# Patient Record
Sex: Male | Born: 1965 | Race: White | Hispanic: No | Marital: Married | Smoking: Current every day smoker
Health system: Southern US, Community
[De-identification: ages and names within clinical notes are randomized; demographics above are authoritative.]

## PROBLEM LIST (undated history)

## (undated) DIAGNOSIS — F1020 Alcohol dependence, uncomplicated: Secondary | ICD-10-CM

## (undated) DIAGNOSIS — F419 Anxiety disorder, unspecified: Secondary | ICD-10-CM

## (undated) DIAGNOSIS — I10 Essential (primary) hypertension: Secondary | ICD-10-CM

## (undated) DIAGNOSIS — J45909 Unspecified asthma, uncomplicated: Secondary | ICD-10-CM

---

## 2003-04-20 ENCOUNTER — Ambulatory Visit (HOSPITAL_COMMUNITY): Admission: RE | Admit: 2003-04-20 | Discharge: 2003-04-20 | Payer: Self-pay | Admitting: General Surgery

## 2003-12-17 ENCOUNTER — Ambulatory Visit (HOSPITAL_COMMUNITY): Admission: RE | Admit: 2003-12-17 | Discharge: 2003-12-17 | Payer: Self-pay | Admitting: Family Medicine

## 2005-07-09 ENCOUNTER — Emergency Department (HOSPITAL_COMMUNITY): Admission: EM | Admit: 2005-07-09 | Discharge: 2005-07-09 | Payer: Self-pay | Admitting: Emergency Medicine

## 2006-07-26 ENCOUNTER — Encounter (HOSPITAL_COMMUNITY): Admission: RE | Admit: 2006-07-26 | Discharge: 2006-08-25 | Payer: Self-pay | Admitting: Family Medicine

## 2006-08-07 ENCOUNTER — Ambulatory Visit: Payer: Self-pay | Admitting: Internal Medicine

## 2006-08-22 ENCOUNTER — Ambulatory Visit: Payer: Self-pay | Admitting: Internal Medicine

## 2006-08-22 ENCOUNTER — Encounter: Payer: Self-pay | Admitting: Internal Medicine

## 2006-08-22 ENCOUNTER — Ambulatory Visit (HOSPITAL_COMMUNITY): Admission: RE | Admit: 2006-08-22 | Discharge: 2006-08-22 | Payer: Self-pay | Admitting: Internal Medicine

## 2006-09-13 ENCOUNTER — Ambulatory Visit (HOSPITAL_COMMUNITY): Admission: RE | Admit: 2006-09-13 | Discharge: 2006-09-13 | Payer: Self-pay | Admitting: Family Medicine

## 2007-07-12 IMAGING — CR DG SMALL BOWEL
14 series · 14 of 14 positions shown · non-contrast
Comparison: none

HISTORY: Refractory nausea, vomiting, diarrhea, weight loss, abdominal pain

[run (1 of 10)]
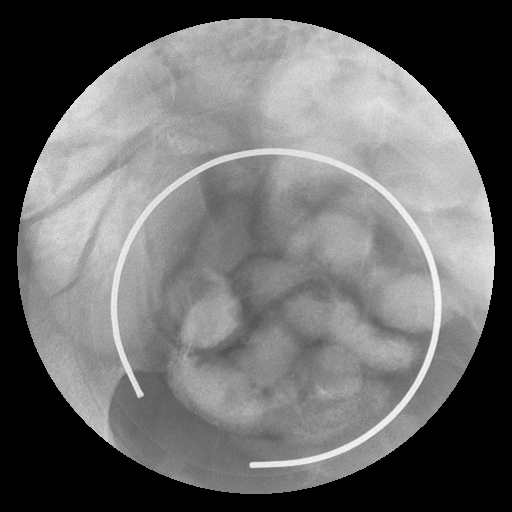

[run (2 of 10)]
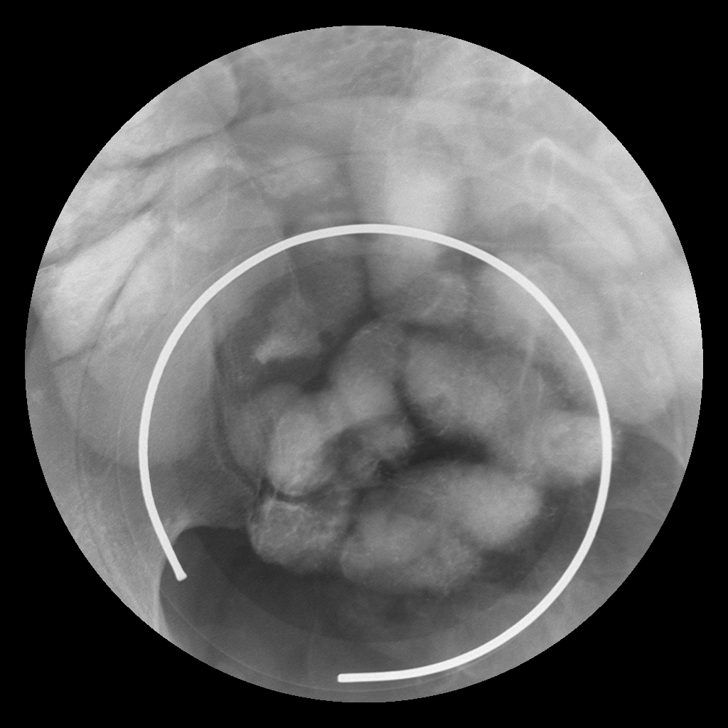

[run (3 of 10)]
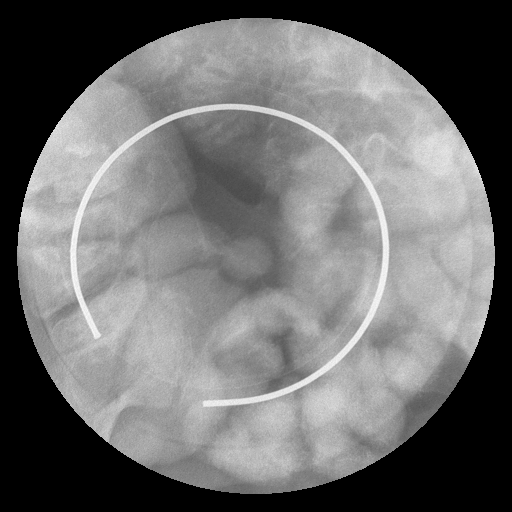

[run (4 of 10)]
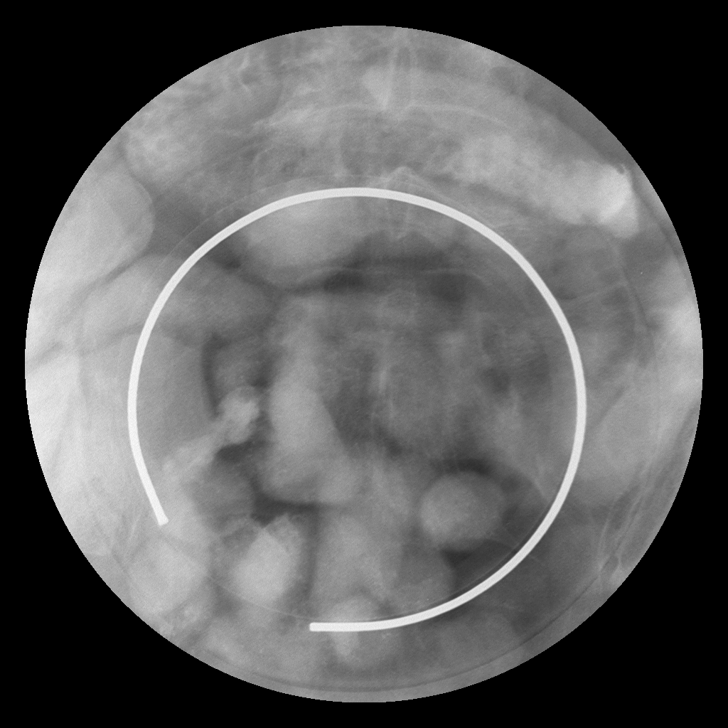

[run (5 of 10)]
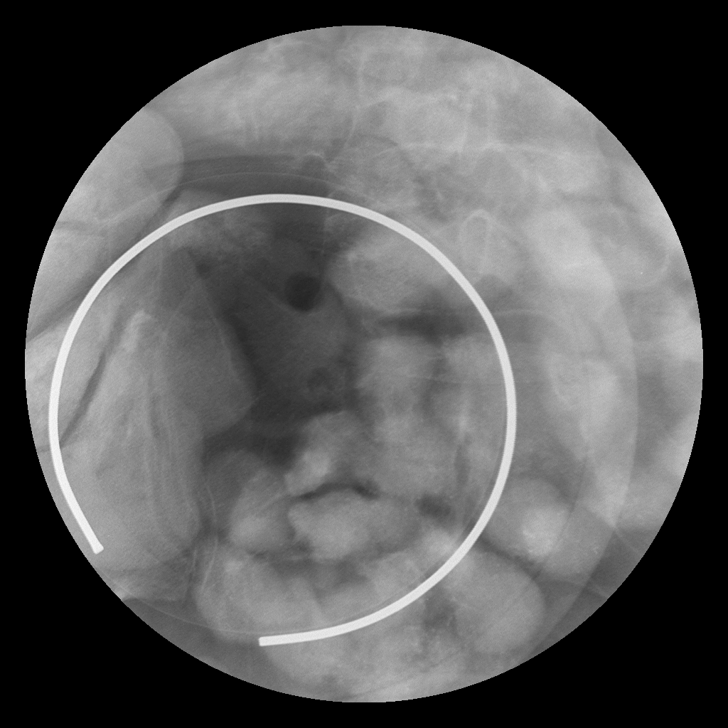

[run (6 of 10)]
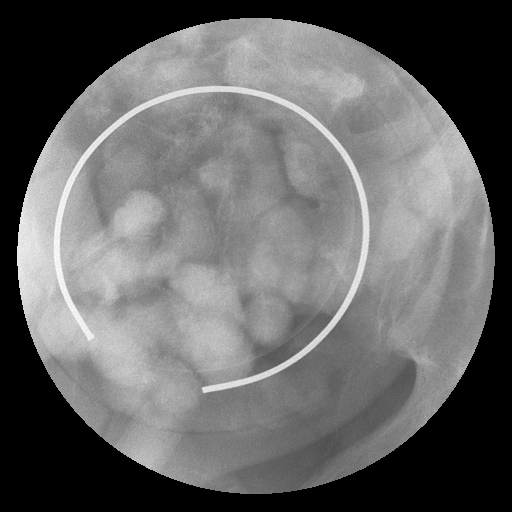

[run (7 of 10)]
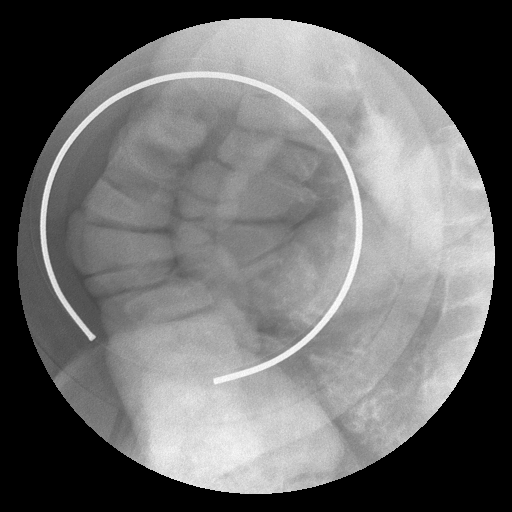

[run (8 of 10)]
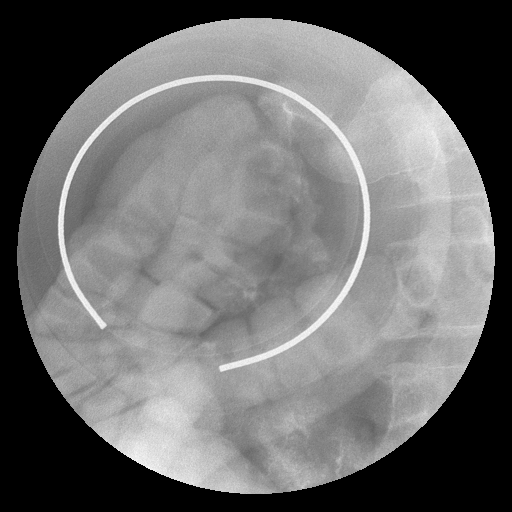

[run (9 of 10)]
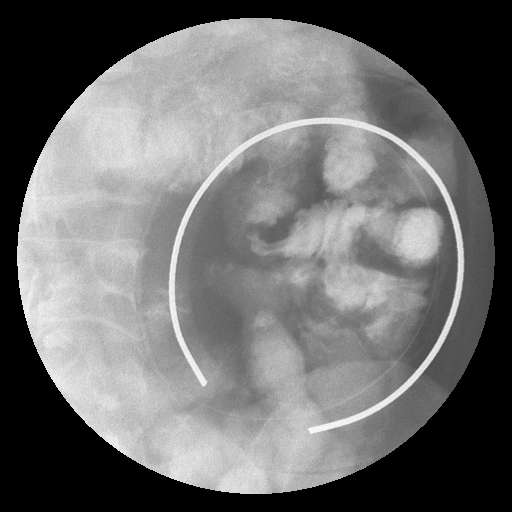

[run (10 of 10)]
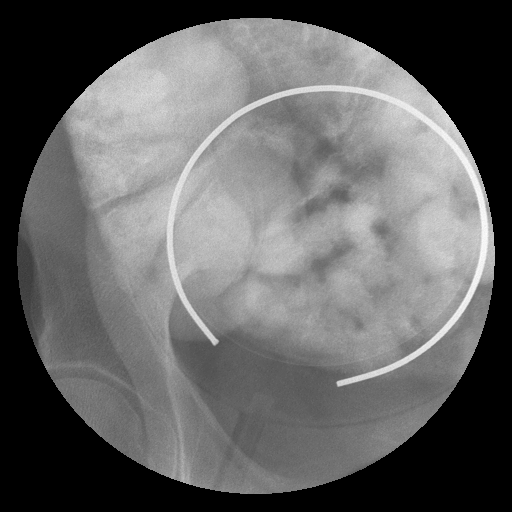

[view not recorded (1 of 4)]
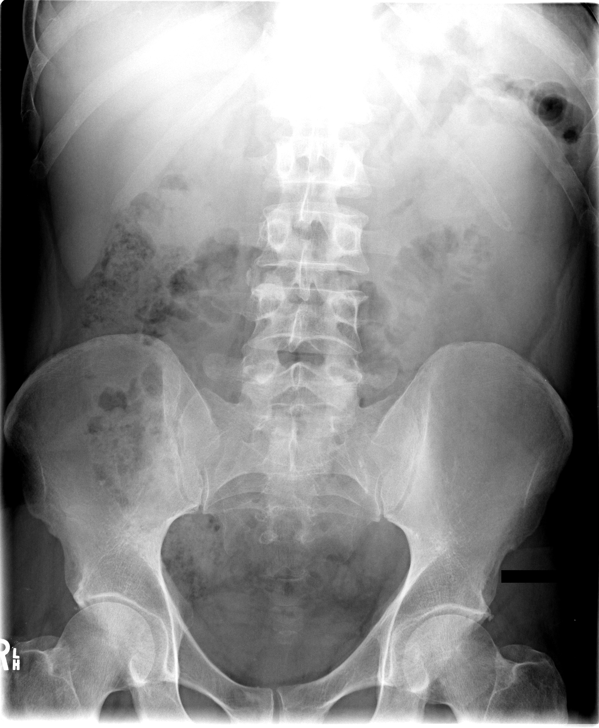

[view not recorded (2 of 4)]
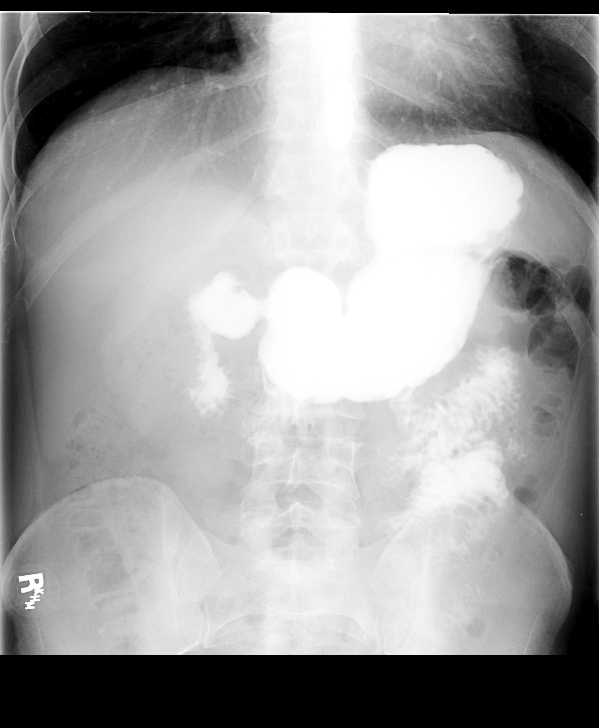

[view not recorded (3 of 4)]
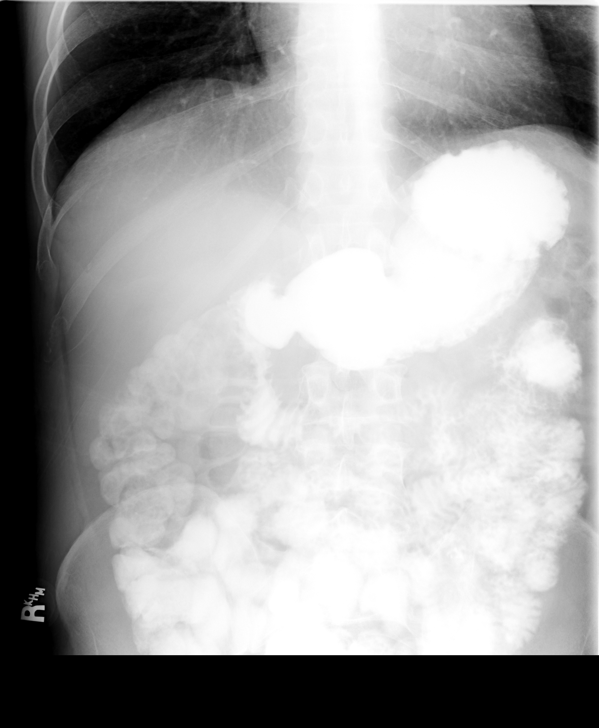

[view not recorded (4 of 4)]
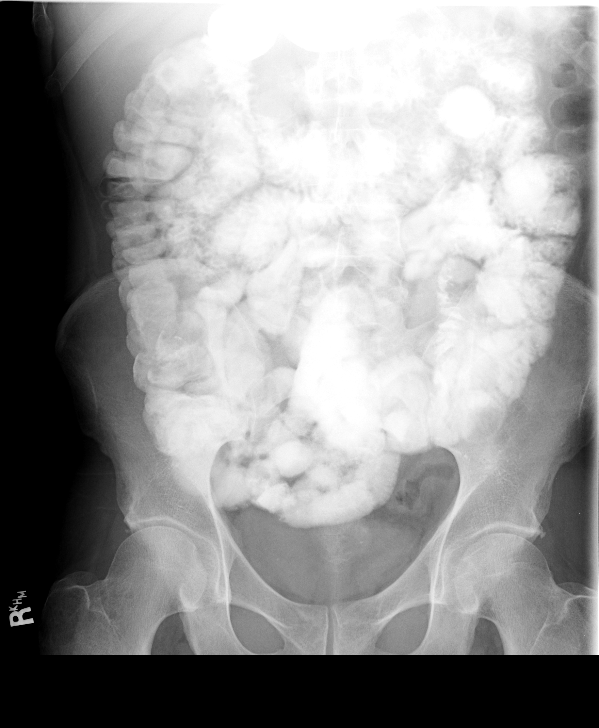

[14 of 14 positions shown; findings below may reference images not displayed]

SMALL BOWEL FOLLOW-THROUGH:

Normal bowel gas pattern on scout image.
No bony abnormalities or urinary tract calcification.
Routine small bowel follow-through performed.

Rapid transit of contrast through small bowel.
Contrast is present in right and transverse colon at 15 minutes following
ingestion of contrast.
Structurally, normal appearing jejunal and ileal loops are noted.
Normal mucosal fold pattern and wall thickness.
No mucosal fold or bowel wall edema, stricture, or dilatation.
No persistent intraluminal filling defects.
Focal compression views of terminal ileum normal.
Visualized: Normal.
Appendix not visualized.
IMPRESSION: Structurally normal appearing jejunal and ileal small bowel loops.
Significant small bowel hypermotility with contrast transiting small bowel to
colon in 15 minutes.
Differential diagnosis of intestinal hypermotility with structurally normal
appearing small bowel loops includes irritable bowel syndrome, thyrotoxicosis,
drugs, hormonal over production such as due to pheochromocytoma, carcinoid,
gastrinoma.
The presence normal appearing mucosal folds and wall thickness makes Crohn's
disease and infectious enteritis etiologies much less likely.

## 2008-01-23 ENCOUNTER — Encounter (INDEPENDENT_AMBULATORY_CARE_PROVIDER_SITE_OTHER): Payer: Self-pay | Admitting: General Surgery

## 2008-01-23 ENCOUNTER — Ambulatory Visit (HOSPITAL_COMMUNITY): Admission: RE | Admit: 2008-01-23 | Discharge: 2008-01-23 | Payer: Self-pay | Admitting: General Surgery

## 2008-02-27 ENCOUNTER — Ambulatory Visit (HOSPITAL_COMMUNITY): Admission: RE | Admit: 2008-02-27 | Discharge: 2008-02-27 | Payer: Self-pay | Admitting: Family Medicine

## 2008-12-25 IMAGING — CR DG FOOT COMPLETE 3+V*R*
3 series · 3 of 3 positions shown · non-contrast
Comparison: None

CLINICAL DATA: Pain over the dorsum of the right foot

RIGHT FOOT COMPLETE - 3+ VIEW

[view not recorded (1 of 3)]
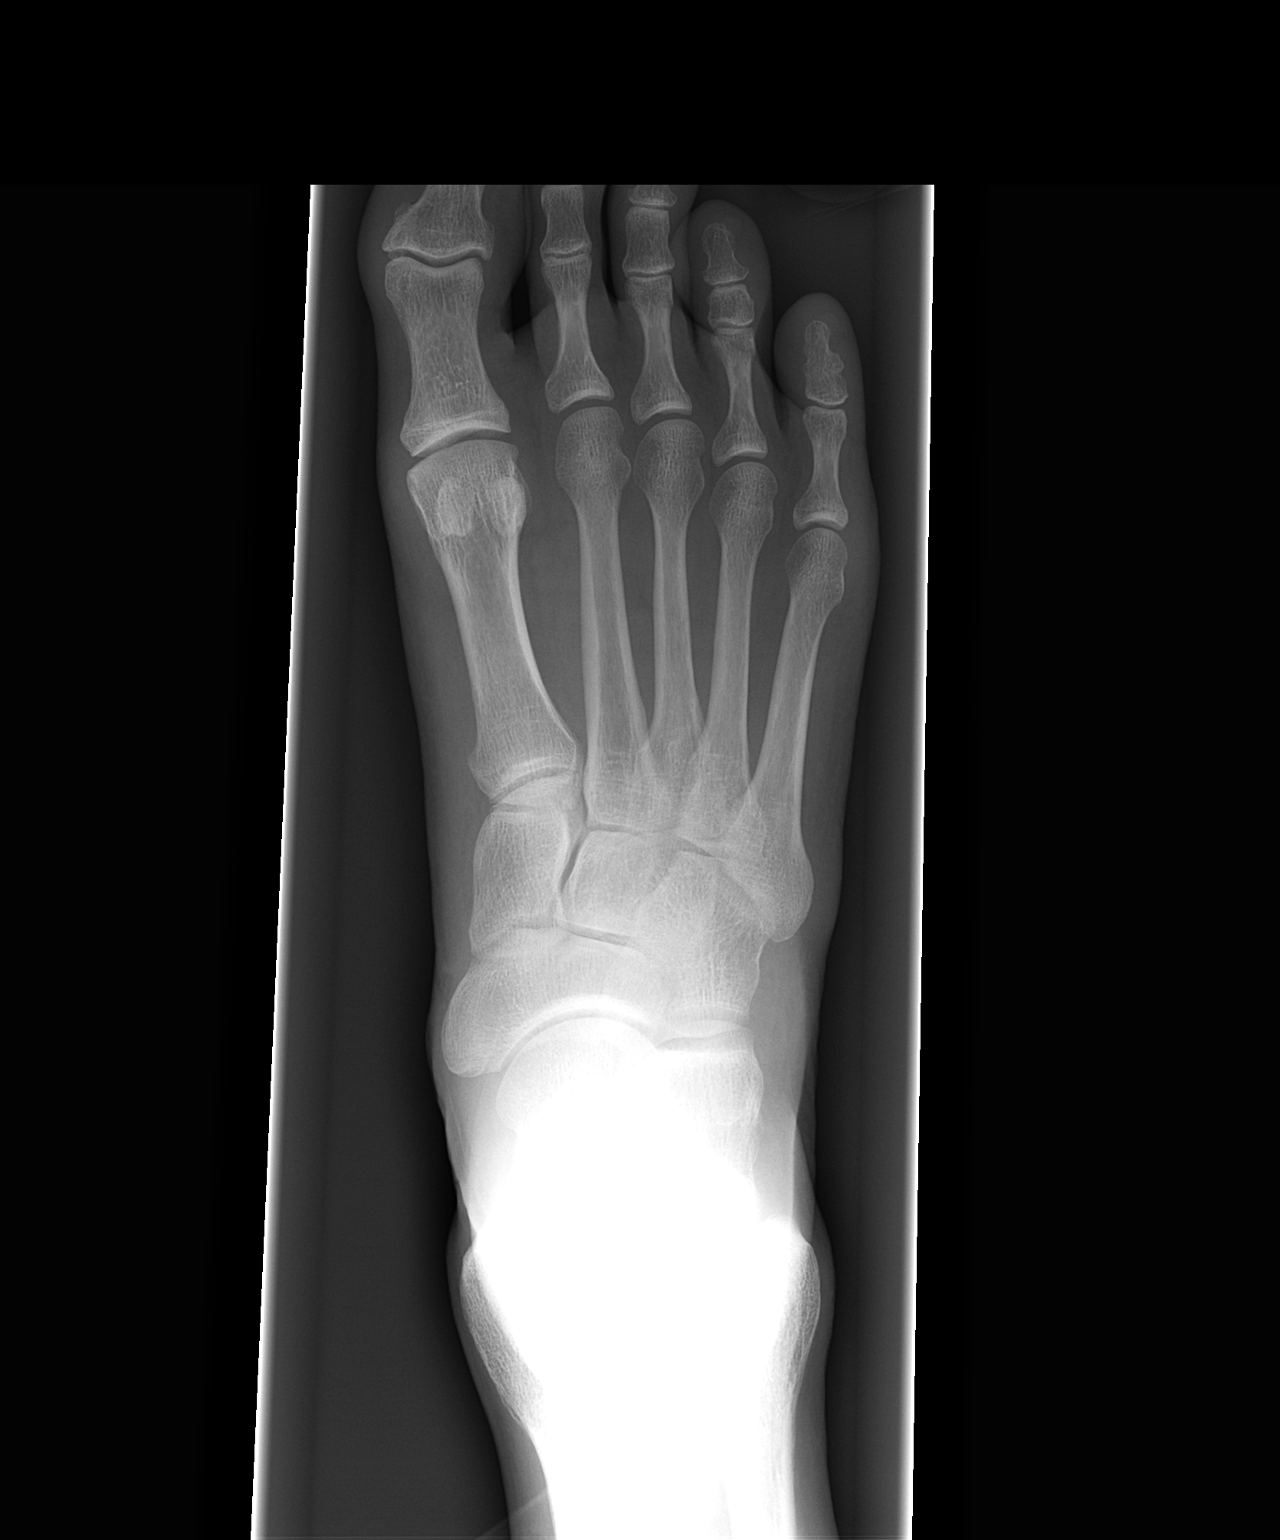

[view not recorded (2 of 3)]
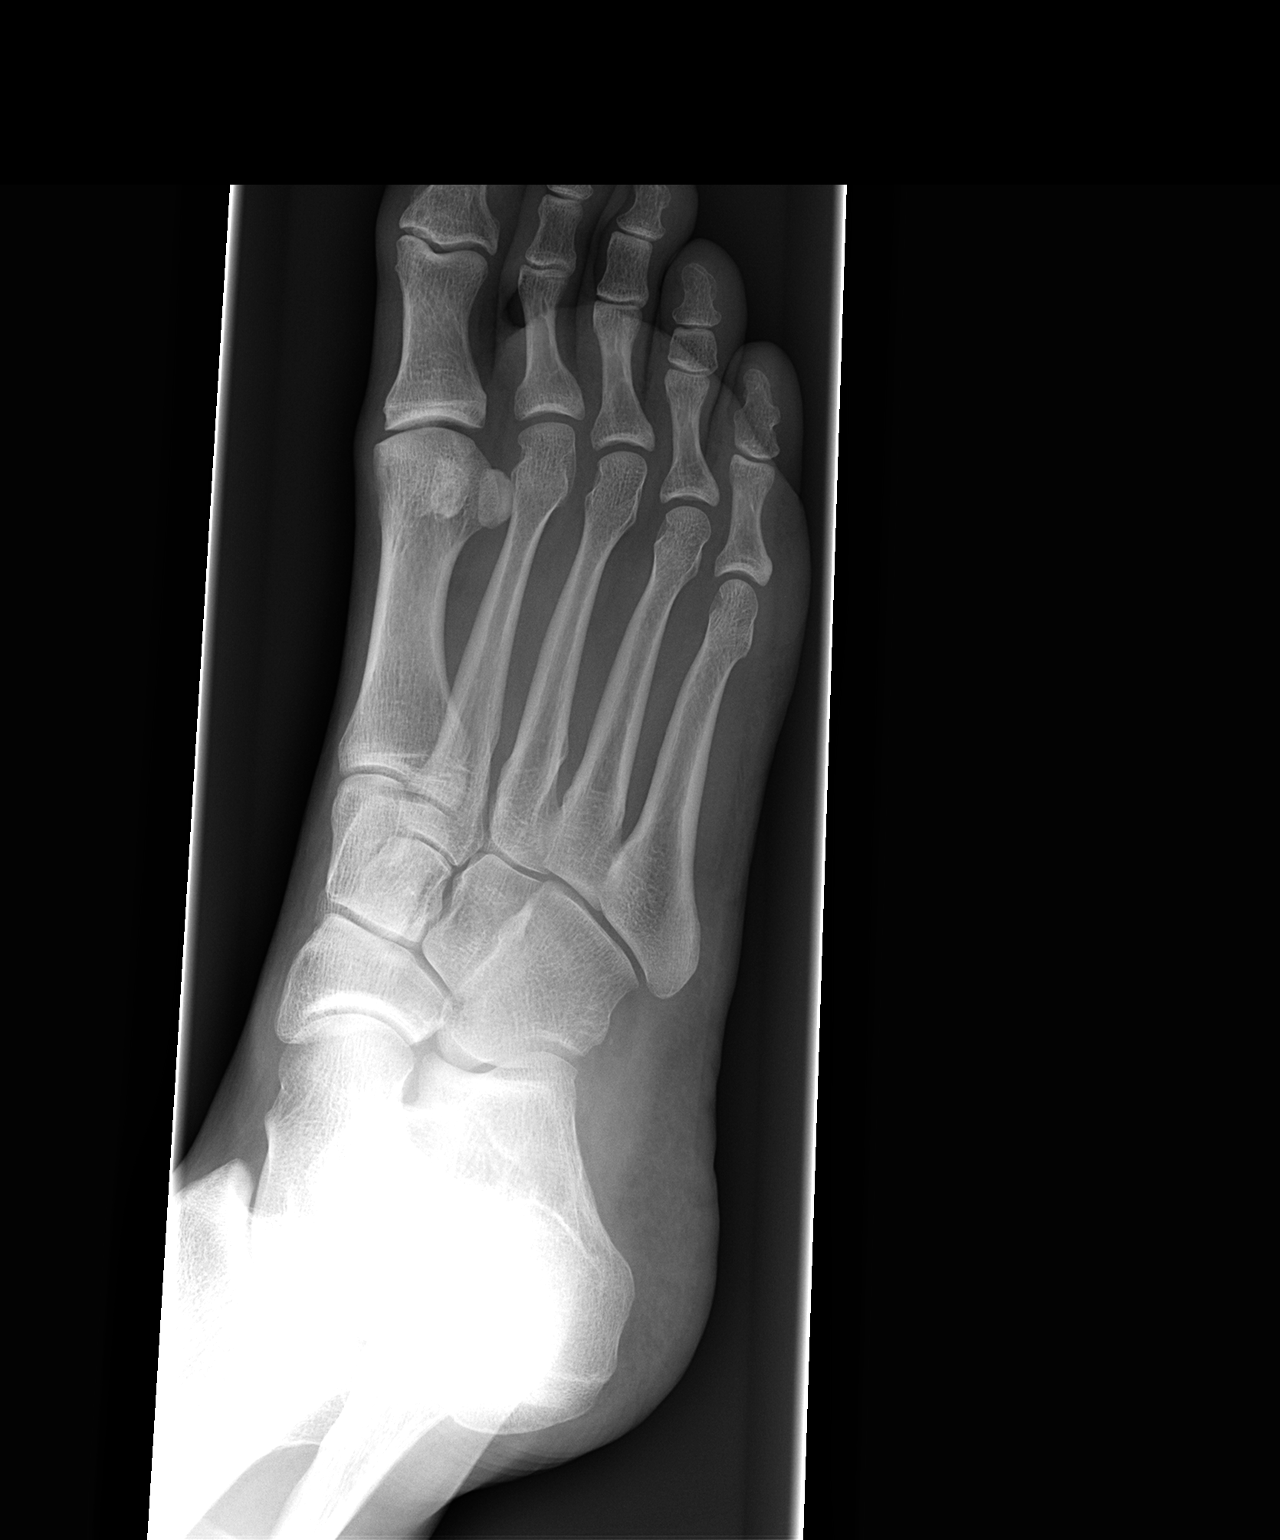

[view not recorded (3 of 3)]
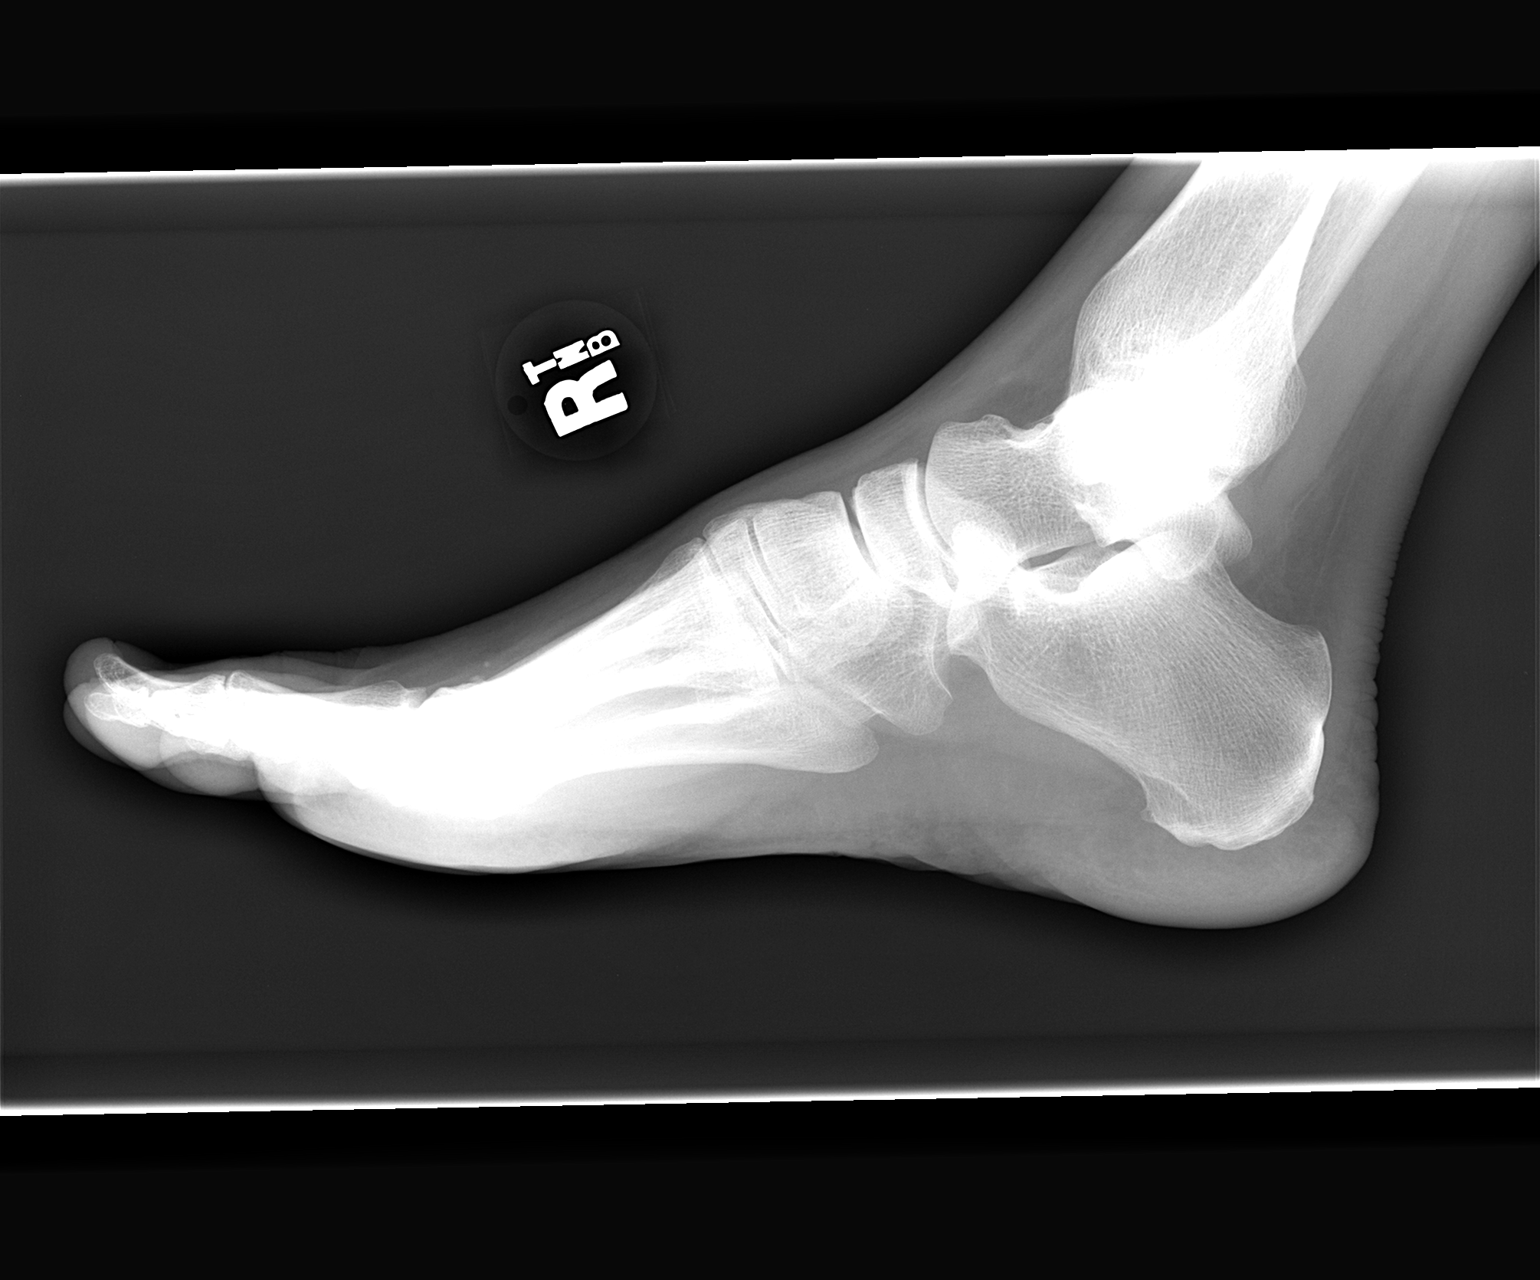

[3 of 3 positions shown; findings below may reference images not displayed]

FINDINGS: There is no evidence of fracture or dislocation.  There
is no evidence of arthropathy or other focal bone abnormality.
Soft tissues are unremarkable.
IMPRESSION: Negative.

## 2009-06-27 ENCOUNTER — Encounter: Payer: Self-pay | Admitting: Internal Medicine

## 2010-04-18 NOTE — Letter (Signed)
Summary: DISABILITY CLAIM  DISABILITY CLAIM   Imported By: Diana Eves 06/27/2009 11:56:22  _____________________________________________________________________  External Attachment:    Type:   Image     Comment:   External Document

## 2010-08-01 NOTE — Op Note (Signed)
NAMECHIDERA, THIVIERGE NO.:  [id number]   MEDICAL RECORD NO.:  [id number]          PATIENT TYPE:  AMB   LOCATION:  DAY                           FACILITY:  [facility]   PHYSICIAN:  R. Roetta Sessions, M.D. DATE OF BIRTH:  [birth year]   DATE OF PROCEDURE:  08/22/2006  DATE OF DISCHARGE:                               OPERATIVE REPORT   PROCEDURE PERFORMED:  Esophagogastroduodenoscopy with gastric and  duodenal biopsy followed by diagnostic colonoscopy.   INDICATIONS FOR PROCEDURE:  45 year old gentleman with a two month  history of intermittent early morning nausea, vomiting, diarrhea, and  intermittent low volume blood per rectum.  He has lost some weight.  He  does drink beer on a regular basis.  He has not gotten any better with  acid suppression therapy or with an empiric course of metronidazole  therapy.  EGD and colonoscopy is now being done.  This approach has been  discussed with the patient at length.  The potential risks, benefits and  alternatives have been reviewed, questions answered.  He is agreeable.  Please see documentation in the medical record.   ESOPHAGOGASTRODUODENOSCOPY PROCEDURE:  This gentleman was taken back to  the endoscopy unit.  We gave him 6 mg of Versed, 100 mg of Demerol, 25  mg Phenergan diluted slow IV push, and Cetacaine spray for topical  pharyngeal anesthesia.  Instrumentation used was Pentax video chip  system.  I intubated the esophagus, however, when doing so, Mr. Vicenta Dunning  became combative and fought [place].  Therefore, I aborted the procedure.  It  was felt that this procedure would be best done under monitored  anesthesia with the help of Dr. Jayme Cloud and associates.  We contacted  Dr. Jayme Cloud and associates and ended up regrouping and going back to  the OR where he was given propofol and fentanyl.  Please note that even  with significant doses of propofol and fentanyl, he at times during both  procedures, was somewhat combative and  we had to keep him still with  physical restraint.  However, both procedures were carried out with  findings as below.   Examination of the tubular esophagus revealed no mucosal abnormality.  The EG junction was easily traversed.  Stomach:  The gastric cavity was empty and insufflated well with air.  A  thorough examination of the gastric mucosa including retroflexion in the  proximal stomach and esophagogastric junction demonstrated only a small  hiatal hernia.  The gastric mucosa, otherwise, appeared normal.  The  pylorus was patent, easily traversed, and the bulb and second portion  revealed no abnormalities.   THERAPEUTIC/DIAGNOSTIC MANEUVERS PERFORMED:  Biopsies of the duodenal  and gastric mucosa were done to rule out eosinophilic gastroenteritis.  The patient tolerated this procedure, overall fairly well, and was  prepared for colonoscopy.   COLONOSCOPY PROCEDURE:  Digital rectal exam revealed no abnormalities.  Endoscopic findings:  The prep was adequate.  Colon:  Colonic mucosa was surveyed from the rectosigmoid junction  through the left, transverse, right colon to the appendiceal orifice,  ileocecal valve, and cecum.  These structures were well  seen  and  photographed for the record.  From this level, the scope was slowly  withdrawn. All previously mentioned mucosal surfaces were again seen.  The colonic mucosa appeared normal.  The scope was pulled down to the  rectum where a thorough examination of the rectal mucosa was undertaken.  The rectal vault was small.  I attempted to but was unable to retroflex.  The patient had some friable anal canal hemorrhoids and anal papilla.  Otherwise, the rectal mucosa appeared normal.  The patient was then  transferred to the PACU in good condition.   IMPRESSION:  1. Normal esophagus, small hiatal hernia, otherwise, normal stomach,      status biopsies D1 and D2.  2. Anal papilla and anal canal hemorrhoids, otherwise, normal  rectum.      Normal appearing colonic mucosa.   I suspect the patient has bled secondary to hemorrhoids.  He does have  an elevated peripheral eosinophil count. Biopsies of the upper GI tract  were taken to rule out eosinophilic gastroenteritis.  It is my present  impression that Mr. Belson drinks too much beer.  This may be  contributing to his diarrhea and rectal bleeding.   RECOMMENDATIONS:  1. Curtail alcohol consumption.  2. Anusol HC suppositories, one per rectum at bedtime.  3. Follow-up on path.  4. Further recommendations to follow.      [name], M.D.  Electronically Signed     RMR/MEDQ  D:  08/22/2006  T:  08/22/2006  Job:  [id number]   cc:   Patrica Duel, M.D.  Fax: [phone]

## 2010-08-01 NOTE — H&P (Signed)
Zachary Schmidt, SENGER NO.:  [id number]   MEDICAL RECORD NO.:  [id number]          PATIENT TYPE:  AMB   LOCATION:  DAY                           FACILITY:  [facility]   PHYSICIAN:  Tilford Pillar, MD      DATE OF BIRTH:  1965/12/20   DATE OF ADMISSION:  DATE OF DISCHARGE:  LH                              HISTORY & PHYSICAL   CHIEF COMPLAINT:  Cyst on face.   HISTORY OF PRESENT ILLNESS:  The patient is a 45 year old male with a  several-month history of a right facial cyst.  He had multiple episodes  of discharge from this and did have this cyst I&D in the past.  He has  also noticed a mole in the similar area, which is suspicious has changed  over the last several years and has had no fever or chills, no nausea  and vomiting, and no pain associated with these __________ areas.   PAST MEDICAL HISTORY:  Hypertension and history of asthma.   PAST SURGICAL HISTORY:  None.   MEDICATIONS:  Vasotec and albuterol 1-2 times per year.   ALLERGIES:  No known drug allergies.   SOCIAL HISTORY:  He is a 2-pack per day smoker.  Alcohol, 2-4 beers per  day.   OCCUPATION:  He is a Naval architect.   PERTINENT FAMILY HISTORY:  Diabetes mellitus and history of heart  disease.   REVIEW OF SYSTEMS:  Unremarkable for all systems including  constitutional, eyes, ears, nose and throat, respiratory,  cardiovascular, gastrointestinal, genitourinary, musculoskeletal, skin,  endocrine, and neuro.   PHYSICAL EXAMINATION:  GENERAL:  The patient is a healthy calm-appearing  male in no acute distress.  He is alert and oriented x3.  HEENT:  Scalp, no deformities.  He does have on his face a small,  approximately, 1-cm mobile round nodule in the  inferior and right  lateral to his right periorbital region.  He also has a small  preauricular papular skin lesion.  Eyes, pupils are equal, round and  reactive.  Extraocular movements are intact.  No conjunctival pallor is noted.  No diminished  hearing is apparent.  Oral mucosa was pink.  Normal occlusion.  NECK:  Trachea is midline.  No thyroid nodularity is noted.  No cervical  lymphadenopathy.  PULMONARY:  He has unlabored respiration.  No wheezes.  No crackles.  He  is clear to auscultation bilaterally.  CARDIOVASCULAR:  Regular rate and  rhythm.  No murmurs.  No gallops are apparent.  He has 2+ radial pulses  bilaterally.  ABDOMEN:  Positive bowel sounds.  Soft and nontender.  SKIN:  Warm and dry.   ASSESSMENT AND PLAN:  1. Sebaceous cyst in the right face.  2. Papular skin lesion of the right preauricular area.  At this time,      I did discuss with the patient the risks, benefits, and      alternatives of excision of these areas as an outpatient including,      but not limited to risk of bleeding, infection, or recurrence.  At  this point, the patient understands and does wish to proceed at the      earliest convenience.      Tilford Pillar, MD  Electronically Signed     BZ/MEDQ  D:  12/18/2007  T:  12/19/2007  Job:  [id number]   cc:   [facility]   Short Stay Surgery

## 2010-08-01 NOTE — Consult Note (Signed)
Zachary Schmidt, Zachary Schmidt               ACCOUNT NO.:  [id number]   MEDICAL RECORD NO.:  [id number]          PATIENT TYPE:  AMB   LOCATION:                                FACILITY:  [facility]   PHYSICIAN:  R. Roetta Sessions, M.D. DATE OF BIRTH:  [birth year]   DATE OF CONSULTATION:  08/07/2006  DATE OF DISCHARGE:                                 CONSULTATION   REASON FOR CONSULTATION:  Nausea, vomiting, diarrhea.   HISTORY OF PRESENT ILLNESS:  Mr. Zachary Schmidt is a pleasant 45 year old  Caucasian male sent over at the courtesy of Dr. Patrica Duel to further  evaluate at least a 6-week history of intermittent early morning nausea,  vomiting and diarrhea of 3-4 loose bowel movements daily, occasional  blood per rectum with stooling when he had multiple bowel movements in a  given day.  He states he has lost a little weight recently, but is  unsure how to quantify this observation.  He does have some reflux  symptoms.  No odynophagia, no dysphagia.  He has not had any melena.  He  was started on Zegerid 40 mg orally daily about 6 weeks ago but could  not tell any difference in his symptoms.  He also takes Phenergan.  He  tells me this is just how he felt when he was diagnosed with an ulcer  down at [facility] in 1986 on EGD.  He does not really take any nonsteroidal  agents.  Apparently, he had some stool studies through Dr. Geanie Logan  office which were negative.  He was treated empirically with a course of  Flagyl, and he also had some labs which were reportedly normal, but do  not have any of those study results for review at this time.   He has not been on any antibiotics recently.  He does have well water at  home, but everybody else is drinking it and is doing okay.  He did have  a colonoscopy 2 years ago done by Dr. Lovell Sheehan for some rectal bleeding,  and it was reported to be negative.   PAST MEDICAL HISTORY:  1. Hypertension.  2. Asthma.  3. History of peptic ulcer disease.  4. 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  Dr. Karilyn Cota saw him briefly and may have      performed a sigmoidoscopy or colonoscopy at that time for rectal      bleeding without significant findings.   PAST SURGICAL HISTORY:  None.   CURRENT MEDICATIONS:  1. Zegerid 40 mg daily.  2. Enalapril 20 mg daily.  3. Phenergan p.r.n.  4. Vicodin p.r.n.   ALLERGIES:  No known drug allergies.   FAMILY HISTORY:  Negative for chronic GI or liver illness.  Mother has  hypertension and diabetes.  Father has hypertension, diabetes and  emphysema.   SOCIAL HISTORY:  The patient is married.  He works as a Curator for  [employer].  He has 2 children in good health.  He smokes 2 packs of  cigarettes per  day.  He drinks 6 to 8 to 12 beers on the weekend; none  during the week.  No illicit drugs.   REVIEW OF SYSTEMS:  No chest pain, no dyspnea on exertion.   PHYSICAL EXAMINATION:  GENERAL:  A 45 year old gentleman resting  comfortably.  VITAL SIGNS:  Weight 158, height 5'8, temperature 97.9, blood pressure  118/84, pulse 67.  SKIN:  Warm and dry.  There is no jaundice or stigmata of chronic liver  disease.  HEENT:  No scleral icterus.  Conjunctivae were pink.  Oral cavity with  no lesions.  CHEST:  Lungs are clear to auscultation.  CARDIAC:  Regular rate and rhythm without murmur, gallop, or rub.  ABDOMEN:  Nondistended, positive bowel sounds, soft, nontender, without  appreciable mass or organomegaly.  EXTREMITIES:  No edema.  RECTAL:  Deferred until the time of colonoscopy.   IMPRESSION:  Mr. Zachary Schmidt is a pleasant 45 year old gentleman now with  a 44-month history of intermittent early morning nausea, vomiting and  diarrhea with intermittent blood per rectum over the same period of  time.  He has lost some weight.  Stool studies are reportedly negative.  He has not responded to a course of metronidazole.  Acid suppression  therapy has made no difference, as  well.  He has a history of peptic  ulcer disease.  At this point, the etiology of his symptoms is not well  defined, and he may have more than one process ongoing.   RECOMMENDATIONS:  I feel for the most expeditious approach we can go  ahead and offer him an EGD and a colonoscopy at this time.  Potential  risks, benefits and alternatives have been reviewed.  Questions  answered.  He is agreeable.  Will made further recommendations once  endoscopic evaluation takes place.  Will also retrieve the above-  mentioned results of her studies already done for review.  Will also get  a copy of Dr. Lovell Sheehan' colonoscopy report.  Will make further  recommendations in the very near future.   I would like to Dr. Patrica Duel for allowing me to see this nice  gentleman today.      [name], M.D.  Electronically Signed     RMR/MEDQ  D:  08/07/2006  T:  08/07/2006  Job:  [id number]   cc:   Patrica Duel, M.D.  Fax: [phone]

## 2010-08-01 NOTE — Op Note (Signed)
[name], [name] NO.:  [id number]   MEDICAL RECORD NO.:  [id number]          PATIENT TYPE:  AMB   LOCATION:  DAY                           FACILITY:  [facility]   PHYSICIAN:  Tilford Pillar, MD      DATE OF BIRTH:  December 14, 1965   DATE OF PROCEDURE:  01/23/2008  DATE OF DISCHARGE:                               OPERATIVE REPORT   PREOPERATIVE DIAGNOSES:  1. Right periorbital sebaceous cyst.  2. Right preauricular skin lesion.   POSTOPERATIVE DIAGNOSES:  1. Right periorbital sebaceous cyst.  2. Right preauricular skin lesion.   PROCEDURE:  1. Excision of a periorbital sebaceous cyst via 1-cm incision.  2. Excision of skin lesion preauricular location via 1-cm incision.   SURGEON:  Tilford Pillar, MD   ANESTHESIA:  General endotracheal, local anesthetic, 1% lidocaine with  epinephrine.   SPECIMEN:  1. Right periorbital sebaceous cyst.  2. Skin lesion.   ESTIMATED BLOOD LOSS:  Minimal.   INDICATIONS:  The patient is a 45 year old male who presented to my  office with a slowly growing cyst of the right periorbital area.  This  is just lateral to the eye.  He has had some discomfort with this and  also noted a skin lesion just anterior to his right ear.  Risks,  benefits, alternatives of excisional of these lesions were discussed  with the patient including but not limited to risk of bleeding,  infection, and recurrence.  The patient's questions and concerns  addressed and the patient was consented for planned procedure.   OPERATION:  The patient was taken to the operating and was placed in the  supine position on the operating room table, at which time the general  anesthetic was administered.  Once the patient was asleep, he had a  laryngeal mask airway placed by Anesthesia.  At this point, his right  face was prepped with Betadine solution and sterile drapes were placed  in a standard fashion.  A marking pen was utilized to mark the planned  course of  incision via an elliptical incision.  Local anesthetic was  instilled.  A scalpel was utilized to create an interstitial skin  incision around the sebaceous cyst.  A needle-tip electrocautery was  utilized to dissect down to the subcuticular tissue circumferentially  around the sebaceous cyst.  Once this was removed, he was placed on the  back table and sent as a permanent specimen to Pathology.  Hemostasis  was excellent and at this time, a 4-0 Monocryl was utilized to  reapproximate the skin edges in a running subcuticular suture at the  skin.   At this time, I then turned attention to the right preauricular skin  lesion.  Again, an elliptical skin incision was created using the  scalpel, additional dissection down to the subcuticular tissue was  carried out using electrocautery.  The lesion was undermined and removed  in total and was placed back on the table and sent as a permanent  specimen to Pathology.  Local anesthetic was instilled.  The hemostasis  was excellent and this point, a 4-0 Monocryl  was utilized to  reapproximate the skin edges in a running subcuticular suture.  Skin was  washed, dried with a moist dry towel, and Benzoin was applied around the  preauricular lesion.  Quarter inch Steri-Strips were placed over both  incisions.  The drapes removed.  The skin was washed and dried with a  moist dry towel.  The patient was allowed to come out of general  anesthetic.  He was transferred back to regular hospital bed and  transferred to the postanesthetic care unit in stable condition.  At the  conclusion of the procedure, all instruments, sponge, and needle counts  were correct.  The patient tolerated the procedure well.      Tilford Pillar, MD  Electronically Signed     BZ/MEDQ  D:  01/23/2008  T:  01/23/2008  Job:  [id number]   cc:   Patrica Duel, M.D.  Fax: [phone]

## 2010-08-01 NOTE — H&P (Signed)
Zachary Schmidt, Zachary Schmidt NO.:  [id number]   MEDICAL RECORD NO.:  [id number]          PATIENT TYPE:  AMB   LOCATION:  DAY                           FACILITY:  [facility]   PHYSICIAN:  Tilford Pillar, MD      DATE OF BIRTH:  March 22, 1965   DATE OF ADMISSION:  DATE OF DISCHARGE:  LH                              HISTORY & PHYSICAL   ADDENDUM   CHIEF COMPLAINT:  Cyst on his face.   HISTORY OF PRESENT ILLNESS:  The patient is a 45 year old male with a  several-month history of right cyst face.  He did have this incised and  drained in the past.  He has had no significant change.  He also has an  area of macular lesion, which has persisted, and has been concerning to  him on the face as well.   He has had no change in his past surgical and medical history.   MEDICATIONS:  He is on Vasotec and albuterol, he takes 1-2 times per  year.   ALLERGIES:  No known drug allergies.   SOCIAL HISTORY:  He is a 2-pack-per-day smoker.  Alcohol, he has 3-4  beers per day.   REVIEW OF SYSTEMS:  Unremarkable.   PHYSICAL EXAMINATION:  Remains unchanged.  He has a cyst in the  periorbital area on the right side of the face as well as a papular  lesions in the preauricular area on the right side as well.   ASSESSMENT AND PLAN:  Cyst on the face and skin lesion.  At this point,  we will plan for excision.  The patient had to reschedule his initial  operation, but at this time, the patient is being rescheduled for a  planned excision.  Risks, benefits, and alternatives being re-explained  to the patient.  We will proceed at his earliest convenience.      Tilford Pillar, MD  Electronically Signed    BZ/MEDQ  D:  01/22/2008  T:  01/23/2008  Job:  [id number]   cc:   [facility]   Short Stay Surgery

## 2010-08-04 NOTE — H&P (Signed)
NAME:  Zachary Schmidt, [name] NO.:  [id number]   MEDICAL RECORD NO.:  [id number]                  PATIENT TYPE:   LOCATION:                                       FACILITY:   PHYSICIAN:  Dalia Heading, M.D.               DATE OF BIRTH:  03-26-1965   DATE OF ADMISSION:  DATE OF DISCHARGE:                                HISTORY & PHYSICAL   CHIEF COMPLAINT:  Hematochezia.   HISTORY OF PRESENT ILLNESS:  The patient is a 45 year old white male who is  referred for endoscopic evaluation.  He needs a colonoscopy due to  hematochezia.  No abdominal pain, weight loss, nausea, vomiting, diarrhea,  constipation, or melena have been noted.  He had a flexible sigmoidoscopy in  the remote past.  There is no family history of colon carcinoma.  He does  have a history of hemorrhoidal disease.   PAST MEDICAL HISTORY:  Includes hypertension.   PAST SURGICAL HISTORY:  As noted above.   CURRENT MEDICATIONS:  Hydrochlorothiazide, Z-Pak for a cold.   ALLERGIES:  No known drug allergies.   REVIEW OF SYSTEMS:  The patient smokes two packs of cigarettes per day.  He  does drink alcohol daily.  He denies any cardiopulmonary difficulties or  bleeding disorders except for intermittent chronic bronchitis.   PHYSICAL EXAMINATION:  GENERAL:  The patient is a well-developed, well-  nourished white male in no acute distress.  VITAL SIGNS:  He is afebrile and vital signs are stable.  LUNGS:  Clear to auscultation with equal breath sounds bilaterally.  HEART:  Reveals a regular rate and rhythm without S3, S4, or murmurs.  ABDOMEN:  Soft, nontender, nondistended.  No hepatosplenomegaly or masses  are noted.  RECTAL:  Deferred to the procedure.   IMPRESSION:  Hematochezia.   PLAN:  The patient is scheduled for a colonoscopy on April 20, 2003.  The  risks and benefits of the procedure including bleeding and perforation were  fully explained to the patient who gave informed  consent.     ___________________________________________                                         Dalia Heading, M.D.   MAJ/MEDQ  D:  04/15/2003  T:  04/15/2003  Job:  [id number]   cc:   Madelin Rear. Sherwood Gambler, M.D.  [address]  [place]  [place] [address]  Fax: [phone]

## 2010-12-19 LAB — BASIC METABOLIC PANEL
Calcium: 9.6
Chloride: 101
Creatinine, Ser: 0.92
Sodium: 136

## 2010-12-19 LAB — CBC
HCT: 44.4
MCHC: 34.2
MCV: 93.1
RBC: 4.78
RDW: 12.8
WBC: 9.9

## 2011-01-04 LAB — CBC
Hemoglobin: 15.7
MCV: 92.3
Platelets: 197
RDW: 12.9
WBC: 9.6

## 2011-01-04 LAB — DIFFERENTIAL
Basophils Relative: 1
Eosinophils Absolute: 0.4
Eosinophils Relative: 5
Lymphs Abs: 1.9
Monocytes Relative: 6
Neutro Abs: 6.5
Neutrophils Relative %: 68

## 2012-12-09 ENCOUNTER — Ambulatory Visit (INDEPENDENT_AMBULATORY_CARE_PROVIDER_SITE_OTHER): Payer: Medicaid Other | Admitting: Urology

## 2012-12-09 DIAGNOSIS — R3129 Other microscopic hematuria: Secondary | ICD-10-CM

## 2012-12-12 ENCOUNTER — Other Ambulatory Visit: Payer: Self-pay | Admitting: Urology

## 2012-12-12 DIAGNOSIS — R319 Hematuria, unspecified: Secondary | ICD-10-CM

## 2012-12-18 ENCOUNTER — Ambulatory Visit (HOSPITAL_COMMUNITY)
Admission: RE | Admit: 2012-12-18 | Discharge: 2012-12-18 | Disposition: A | Discharge status: Home / Self Care | Payer: Medicaid Other | Source: Ambulatory Visit | Attending: Urology | Admitting: Urology

## 2012-12-18 DIAGNOSIS — R911 Solitary pulmonary nodule: Secondary | ICD-10-CM | POA: Insufficient documentation

## 2012-12-18 DIAGNOSIS — R319 Hematuria, unspecified: Secondary | ICD-10-CM | POA: Insufficient documentation

## 2012-12-18 MED ORDER — IOHEXOL 300 MG/ML  SOLN
125.0000 mL | Freq: Once | INTRAMUSCULAR | Status: AC | PRN
  Administered 2012-12-18: 125 mL via INTRAVENOUS

## 2014-03-19 DIAGNOSIS — F1020 Alcohol dependence, uncomplicated: Secondary | ICD-10-CM

## 2014-03-19 HISTORY — DX: Alcohol dependence, uncomplicated: F10.20

## 2014-04-14 ENCOUNTER — Emergency Department (HOSPITAL_COMMUNITY): Payer: 59

## 2014-04-14 ENCOUNTER — Encounter (HOSPITAL_COMMUNITY): Payer: Self-pay | Admitting: Radiology

## 2014-04-14 ENCOUNTER — Emergency Department (HOSPITAL_COMMUNITY)
Admission: EM | Admit: 2014-04-14 | Discharge: 2014-04-15 | Disposition: A | Discharge status: Home / Self Care | Payer: 59 | Attending: Emergency Medicine | Admitting: Emergency Medicine

## 2014-04-14 DIAGNOSIS — S2232XA Fracture of one rib, left side, initial encounter for closed fracture: Secondary | ICD-10-CM | POA: Insufficient documentation

## 2014-04-14 DIAGNOSIS — Y9389 Activity, other specified: Secondary | ICD-10-CM | POA: Diagnosis not present

## 2014-04-14 DIAGNOSIS — S01111A Laceration without foreign body of right eyelid and periocular area, initial encounter: Secondary | ICD-10-CM | POA: Diagnosis not present

## 2014-04-14 DIAGNOSIS — Y92410 Unspecified street and highway as the place of occurrence of the external cause: Secondary | ICD-10-CM | POA: Diagnosis not present

## 2014-04-14 DIAGNOSIS — Y998 Other external cause status: Secondary | ICD-10-CM | POA: Insufficient documentation

## 2014-04-14 DIAGNOSIS — Z23 Encounter for immunization: Secondary | ICD-10-CM | POA: Insufficient documentation

## 2014-04-14 DIAGNOSIS — S29001A Unspecified injury of muscle and tendon of front wall of thorax, initial encounter: Secondary | ICD-10-CM | POA: Diagnosis present

## 2014-04-14 HISTORY — DX: Essential (primary) hypertension: I10

## 2014-04-14 LAB — COMPREHENSIVE METABOLIC PANEL
ALBUMIN: 4 g/dL (ref 3.5–5.2)
ALK PHOS: 27 U/L — AB (ref 39–117)
ALT: 24 U/L (ref 0–53)
ANION GAP: 14 (ref 5–15)
AST: 35 U/L (ref 0–37)
BUN: 10 mg/dL (ref 6–23)
CALCIUM: 8.9 mg/dL (ref 8.4–10.5)
CO2: 19 mmol/L (ref 19–32)
Chloride: 103 mmol/L (ref 96–112)
Creatinine, Ser: 1 mg/dL (ref 0.50–1.35)
GFR calc non Af Amer: 87 mL/min — ABNORMAL LOW (ref >90)
Glucose, Bld: 106 mg/dL — ABNORMAL HIGH (ref 70–99)
Potassium: 3.1 mmol/L — ABNORMAL LOW (ref 3.5–5.1)
Sodium: 136 mmol/L (ref 135–145)
Total Bilirubin: 0.7 mg/dL (ref 0.3–1.2)
Total Protein: 6.7 g/dL (ref 6.0–8.3)

## 2014-04-14 LAB — CDS SEROLOGY

## 2014-04-14 LAB — CBC
HEMATOCRIT: 40.9 % (ref 39.0–52.0)
Hemoglobin: 14.2 g/dL (ref 13.0–17.0)
MCH: 33.2 pg (ref 26.0–34.0)
MCHC: 34.7 g/dL (ref 30.0–36.0)
MCV: 95.6 fL (ref 78.0–100.0)
PLATELETS: 241 10*3/uL (ref 150–400)
RBC: 4.28 MIL/uL (ref 4.22–5.81)
RDW: 13.1 % (ref 11.5–15.5)
WBC: 14.2 10*3/uL — AB (ref 4.0–10.5)

## 2014-04-14 LAB — SAMPLE TO BLOOD BANK

## 2014-04-14 LAB — PROTIME-INR
INR: 0.91 (ref 0.00–1.49)
PROTHROMBIN TIME: 12.4 s (ref 11.6–15.2)

## 2014-04-14 LAB — ETHANOL: Alcohol, Ethyl (B): 340 mg/dL — ABNORMAL HIGH (ref 0–9)

## 2014-04-14 MED ORDER — HYDROMORPHONE HCL 1 MG/ML IJ SOLN
1.0000 mg | Freq: Once | INTRAMUSCULAR | Status: DC
  Filled 2014-04-14: qty 1

## 2014-04-14 MED ORDER — TETANUS-DIPHTH-ACELL PERTUSSIS 5-2.5-18.5 LF-MCG/0.5 IM SUSP
0.5000 mL | Freq: Once | INTRAMUSCULAR | Status: AC
  Administered 2014-04-15: 0.5 mL via INTRAMUSCULAR
  Filled 2014-04-14: qty 0.5

## 2014-04-14 MED ORDER — LIDOCAINE-EPINEPHRINE (PF) 2 %-1:200000 IJ SOLN
10.0000 mL | Freq: Once | INTRAMUSCULAR | Status: AC
  Administered 2014-04-15: 10 mL via INTRADERMAL
  Filled 2014-04-14: qty 20

## 2014-04-14 MED ORDER — IOHEXOL 300 MG/ML  SOLN
100.0000 mL | Freq: Once | INTRAMUSCULAR | Status: AC | PRN
  Administered 2014-04-14: 100 mL via INTRAVENOUS

## 2014-04-14 NOTE — ED Notes (Signed)
Wife at beside.

## 2014-04-14 NOTE — ED Notes (Signed)
All patient belongings collected and placed into belongings bags. Belongings include: Shirt x2(cut off by EMS), Jeans(cut off by EMS, Belt(cut in multiple places by EMS), Tan Jacket(intact), 2 Tan colored Boots(intact). Items found in pockets included wallet, assorted change, ink pen, eye drops, lip balm, and folded piece of paper. All belongings from pockets placed inside patient's boots per request of patient. All belongings remain at bedside in patient custody.

## 2014-04-14 NOTE — Progress Notes (Addendum)
Pt was responsive when I arrived. He wanted me to call his wife. Obtained contact info from pt chart and called pt's wife. She said she would be on her way. Communicated same to pt and staff. Marjory Liesamela Carrington Holder Chaplain   04/14/14 2200  Clinical Encounter Type  Visited With Patient
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
## 2014-04-14 NOTE — ED Notes (Signed)
Patient transported to CT scan with RN.

## 2014-04-14 NOTE — ED Notes (Signed)
Patient returned from CT and transferred to D36. All belongings transferred to room with patient.

## 2014-04-15 ENCOUNTER — Encounter (HOSPITAL_COMMUNITY): Payer: Self-pay | Admitting: Emergency Medicine

## 2014-04-15 MED ORDER — OXYCODONE HCL 5 MG PO TABS: 5.0000 mg | ORAL_TABLET | ORAL | Status: AC | PRN

## 2014-04-15 MED ORDER — FENTANYL CITRATE 0.05 MG/ML IJ SOLN: 50.0000 ug | Freq: Once | INTRAMUSCULAR | Status: DC

## 2014-04-15 MED ORDER — SODIUM CHLORIDE 0.9 % IV BOLUS (SEPSIS)
1000.0000 mL | Freq: Once | INTRAVENOUS | Status: AC
  Administered 2014-04-15: 1000 mL via INTRAVENOUS

## 2014-04-15 NOTE — ED Provider Notes (Signed)
Patient has been observed in the ED and is resting comfortably and is showing no signs of alcohol excessively. He has a family member with him who can watch him and he is discharged home.  Dione Boozeavid Hawa Henly, MD 04/15/14 [phone]
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
## 2014-04-15 NOTE — ED Notes (Signed)
Patient ambulatory independently. Reports no dizziness or lightheadedness. Gait steady with intact balance.

## 2014-04-15 NOTE — ED Notes (Signed)
EMS C-Collar remains at this time.

## 2014-04-15 NOTE — ED Notes (Signed)
Docherty MD informed of patient's hypotension. Advises this RN to hold pain medications until blood pressure recovers. NS Bolus ordered.

## 2014-04-15 NOTE — ED Notes (Signed)
Patient BP remains low when sitting and standing. HR remained constant, NAD observed, no new symptoms with position change. MD Preston FleetingGlick informed of patient's low BP readings and advises if patient is ambulatory and feels well to continue with discharge plan.

## 2015-10-01 ENCOUNTER — Emergency Department (HOSPITAL_COMMUNITY): Payer: Medicaid Other

## 2015-10-01 ENCOUNTER — Inpatient Hospital Stay (HOSPITAL_COMMUNITY)
Admission: EM | Admit: 2015-10-01 | Discharge: 2015-10-18 | DRG: 207 | Disposition: E | Payer: Medicaid Other | Attending: Pulmonary Disease | Admitting: Pulmonary Disease

## 2015-10-01 ENCOUNTER — Encounter (HOSPITAL_COMMUNITY): Payer: Self-pay | Admitting: General Surgery

## 2015-10-01 DIAGNOSIS — N390 Urinary tract infection, site not specified: Secondary | ICD-10-CM | POA: Diagnosis present

## 2015-10-01 DIAGNOSIS — I469 Cardiac arrest, cause unspecified: Secondary | ICD-10-CM | POA: Diagnosis present

## 2015-10-01 DIAGNOSIS — I10 Essential (primary) hypertension: Secondary | ICD-10-CM | POA: Diagnosis present

## 2015-10-01 DIAGNOSIS — J9602 Acute respiratory failure with hypercapnia: Secondary | ICD-10-CM | POA: Diagnosis present

## 2015-10-01 DIAGNOSIS — R4182 Altered mental status, unspecified: Secondary | ICD-10-CM | POA: Insufficient documentation

## 2015-10-01 DIAGNOSIS — J9690 Respiratory failure, unspecified, unspecified whether with hypoxia or hypercapnia: Secondary | ICD-10-CM

## 2015-10-01 DIAGNOSIS — Z66 Do not resuscitate: Secondary | ICD-10-CM | POA: Diagnosis not present

## 2015-10-01 DIAGNOSIS — F17210 Nicotine dependence, cigarettes, uncomplicated: Secondary | ICD-10-CM | POA: Diagnosis present

## 2015-10-01 DIAGNOSIS — D696 Thrombocytopenia, unspecified: Secondary | ICD-10-CM | POA: Diagnosis present

## 2015-10-01 DIAGNOSIS — G931 Anoxic brain damage, not elsewhere classified: Secondary | ICD-10-CM | POA: Diagnosis present

## 2015-10-01 DIAGNOSIS — Z515 Encounter for palliative care: Secondary | ICD-10-CM | POA: Diagnosis not present

## 2015-10-01 DIAGNOSIS — E876 Hypokalemia: Secondary | ICD-10-CM | POA: Diagnosis present

## 2015-10-01 DIAGNOSIS — E874 Mixed disorder of acid-base balance: Secondary | ICD-10-CM | POA: Diagnosis present

## 2015-10-01 DIAGNOSIS — E872 Acidosis, unspecified: Secondary | ICD-10-CM

## 2015-10-01 DIAGNOSIS — F10129 Alcohol abuse with intoxication, unspecified: Secondary | ICD-10-CM

## 2015-10-01 DIAGNOSIS — J9600 Acute respiratory failure, unspecified whether with hypoxia or hypercapnia: Secondary | ICD-10-CM | POA: Diagnosis present

## 2015-10-01 DIAGNOSIS — F10229 Alcohol dependence with intoxication, unspecified: Secondary | ICD-10-CM | POA: Diagnosis present

## 2015-10-01 DIAGNOSIS — J690 Pneumonitis due to inhalation of food and vomit: Secondary | ICD-10-CM | POA: Diagnosis present

## 2015-10-01 DIAGNOSIS — I472 Ventricular tachycardia: Secondary | ICD-10-CM | POA: Diagnosis present

## 2015-10-01 DIAGNOSIS — R739 Hyperglycemia, unspecified: Secondary | ICD-10-CM | POA: Diagnosis present

## 2015-10-01 DIAGNOSIS — J9601 Acute respiratory failure with hypoxia: Principal | ICD-10-CM | POA: Diagnosis present

## 2015-10-01 DIAGNOSIS — R9431 Abnormal electrocardiogram [ECG] [EKG]: Secondary | ICD-10-CM

## 2015-10-01 DIAGNOSIS — G936 Cerebral edema: Secondary | ICD-10-CM | POA: Diagnosis present

## 2015-10-01 DIAGNOSIS — R918 Other nonspecific abnormal finding of lung field: Secondary | ICD-10-CM | POA: Diagnosis present

## 2015-10-01 DIAGNOSIS — Y908 Blood alcohol level of 240 mg/100 ml or more: Secondary | ICD-10-CM | POA: Diagnosis present

## 2015-10-01 DIAGNOSIS — G253 Myoclonus: Secondary | ICD-10-CM | POA: Diagnosis present

## 2015-10-01 HISTORY — DX: Unspecified asthma, uncomplicated: J45.909

## 2015-10-01 HISTORY — DX: Alcohol dependence, uncomplicated: F10.20

## 2015-10-01 HISTORY — DX: Anxiety disorder, unspecified: F41.9

## 2015-10-01 LAB — I-STAT CHEM 8, ED
BUN: 6 mg/dL (ref 6–20)
Calcium, Ion: 1.01 mmol/L — ABNORMAL LOW (ref 1.13–1.30)
Chloride: 99 mmol/L — ABNORMAL LOW (ref 101–111)
Creatinine, Ser: 1.3 mg/dL — ABNORMAL HIGH (ref 0.61–1.24)
Glucose, Bld: 190 mg/dL — ABNORMAL HIGH (ref 65–99)
HEMATOCRIT: 45 % (ref 39.0–52.0)
HEMOGLOBIN: 15.3 g/dL (ref 13.0–17.0)
POTASSIUM: 2.5 mmol/L — AB (ref 3.5–5.1)
SODIUM: 137 mmol/L (ref 135–145)
TCO2: 18 mmol/L (ref 0–100)

## 2015-10-01 LAB — CBC
HEMATOCRIT: 42.1 % (ref 39.0–52.0)
Hemoglobin: 13.8 g/dL (ref 13.0–17.0)
MCH: 34.6 pg — ABNORMAL HIGH (ref 26.0–34.0)
MCHC: 32.8 g/dL (ref 30.0–36.0)
MCV: 105.5 fL — ABNORMAL HIGH (ref 78.0–100.0)
Platelets: 144 10*3/uL — ABNORMAL LOW (ref 150–400)
RBC: 3.99 MIL/uL — ABNORMAL LOW (ref 4.22–5.81)
RDW: 13.4 % (ref 11.5–15.5)
WBC: 14.6 10*3/uL — ABNORMAL HIGH (ref 4.0–10.5)

## 2015-10-01 LAB — COMPREHENSIVE METABOLIC PANEL
ALBUMIN: 2.8 g/dL — AB (ref 3.5–5.0)
ALT: 126 U/L — AB (ref 17–63)
AST: 231 U/L — AB (ref 15–41)
Alkaline Phosphatase: 45 U/L (ref 38–126)
Anion gap: 19 — ABNORMAL HIGH (ref 5–15)
BUN: 6 mg/dL (ref 6–20)
CALCIUM: 7.8 mg/dL — AB (ref 8.9–10.3)
CHLORIDE: 102 mmol/L (ref 101–111)
CO2: 16 mmol/L — AB (ref 22–32)
Creatinine, Ser: 1.16 mg/dL (ref 0.61–1.24)
GFR calc Af Amer: >60 mL/min (ref >60)
GFR calc non Af Amer: >60 mL/min (ref >60)
GLUCOSE: 203 mg/dL — AB (ref 65–99)
POTASSIUM: 2.5 mmol/L — AB (ref 3.5–5.1)
SODIUM: 137 mmol/L (ref 135–145)
TOTAL PROTEIN: 5.2 g/dL — AB (ref 6.5–8.1)
Total Bilirubin: 1 mg/dL (ref 0.3–1.2)

## 2015-10-01 LAB — URINE MICROSCOPIC-ADD ON

## 2015-10-01 LAB — RAPID URINE DRUG SCREEN, HOSP PERFORMED
Amphetamines: NOT DETECTED
BARBITURATES: NOT DETECTED
BENZODIAZEPINES: POSITIVE — AB
COCAINE: NOT DETECTED
OPIATES: NOT DETECTED
Tetrahydrocannabinol: POSITIVE — AB

## 2015-10-01 LAB — URINALYSIS, ROUTINE W REFLEX MICROSCOPIC
BILIRUBIN URINE: NEGATIVE
GLUCOSE, UA: 100 mg/dL — AB
Ketones, ur: NEGATIVE mg/dL
Nitrite: NEGATIVE
Protein, ur: >300 mg/dL — AB
SPECIFIC GRAVITY, URINE: 1.015 (ref 1.005–1.030)
pH: 7 (ref 5.0–8.0)

## 2015-10-01 LAB — PREPARE FRESH FROZEN PLASMA
UNIT DIVISION: 0
Unit division: 0

## 2015-10-01 LAB — I-STAT ARTERIAL BLOOD GAS, ED
ACID-BASE DEFICIT: 13 mmol/L — AB (ref 0.0–2.0)
Bicarbonate: 19.5 mEq/L — ABNORMAL LOW (ref 20.0–24.0)
O2 SAT: 100 %
PH ART: 7.039 — AB (ref 7.350–7.450)
TCO2: 22 mmol/L (ref 0–100)
pCO2 arterial: 72.4 mmHg (ref 35.0–45.0)
pO2, Arterial: 612 mmHg — ABNORMAL HIGH (ref 80.0–100.0)

## 2015-10-01 LAB — MAGNESIUM: Magnesium: 2.4 mg/dL (ref 1.7–2.4)

## 2015-10-01 LAB — I-STAT TROPONIN, ED: TROPONIN I, POC: 0.01 ng/mL (ref 0.00–0.08)

## 2015-10-01 LAB — CDS SEROLOGY

## 2015-10-01 LAB — I-STAT CG4 LACTIC ACID, ED: LACTIC ACID, VENOUS: 9.82 mmol/L — AB (ref 0.5–1.9)

## 2015-10-01 LAB — TRIGLYCERIDES: TRIGLYCERIDES: 154 mg/dL — AB (ref <150)

## 2015-10-01 LAB — PROTIME-INR
INR: 1.15 (ref 0.00–1.49)
Prothrombin Time: 14.9 seconds (ref 11.6–15.2)

## 2015-10-01 LAB — ETHANOL: Alcohol, Ethyl (B): 318 mg/dL (ref <5)

## 2015-10-01 MED ORDER — POTASSIUM CHLORIDE 10 MEQ/100ML IV SOLN
10.0000 meq | INTRAVENOUS | Status: AC
  Administered 2015-10-02 (×3): 10 meq via INTRAVENOUS
  Filled 2015-10-01 (×4): qty 100

## 2015-10-01 MED ORDER — PROPOFOL 1000 MG/100ML IV EMUL
0.0000 ug/kg/min | INTRAVENOUS | Status: DC
  Administered 2015-10-01: 10 ug/kg/min via INTRAVENOUS
  Administered 2015-10-02: 25 ug/kg/min via INTRAVENOUS
  Administered 2015-10-02: 20 ug/kg/min via INTRAVENOUS
  Administered 2015-10-03: 50 ug/kg/min via INTRAVENOUS
  Administered 2015-10-03: 40 ug/kg/min via INTRAVENOUS
  Administered 2015-10-03: 20 ug/kg/min via INTRAVENOUS
  Administered 2015-10-03 (×2): 50 ug/kg/min via INTRAVENOUS
  Administered 2015-10-04: 45 ug/kg/min via INTRAVENOUS
  Administered 2015-10-04 (×2): 50 ug/kg/min via INTRAVENOUS
  Administered 2015-10-04: 35 ug/kg/min via INTRAVENOUS
  Administered 2015-10-05 (×2): 50 ug/kg/min via INTRAVENOUS
  Administered 2015-10-05: 35 ug/kg/min via INTRAVENOUS
  Administered 2015-10-05: 50 ug/kg/min via INTRAVENOUS
  Filled 2015-10-01 (×16): qty 100

## 2015-10-01 MED ORDER — PROPOFOL 1000 MG/100ML IV EMUL
INTRAVENOUS | Status: AC
  Filled 2015-10-01: qty 100

## 2015-10-01 MED ORDER — FENTANYL CITRATE (PF) 100 MCG/2ML IJ SOLN
INTRAMUSCULAR | Status: AC
  Administered 2015-10-01: 100 ug
  Filled 2015-10-01: qty 2

## 2015-10-01 MED ORDER — POTASSIUM CHLORIDE 10 MEQ/100ML IV SOLN
10.0000 meq | Freq: Once | INTRAVENOUS | Status: AC
  Administered 2015-10-01: 10 meq via INTRAVENOUS

## 2015-10-01 MED ORDER — FENTANYL CITRATE (PF) 100 MCG/2ML IJ SOLN
INTRAMUSCULAR | Status: AC | PRN
  Administered 2015-10-01 (×2): 50 ug via INTRAVENOUS
  Administered 2015-10-01: 100 ug via INTRAVENOUS

## 2015-10-01 MED ORDER — MIDAZOLAM HCL 2 MG/2ML IJ SOLN
INTRAMUSCULAR | Status: AC
  Administered 2015-10-01: 2 mg
  Filled 2015-10-01: qty 4

## 2015-10-01 MED ORDER — MIDAZOLAM HCL 5 MG/5ML IJ SOLN
INTRAMUSCULAR | Status: AC | PRN
  Administered 2015-10-01: 2 mg via INTRAVENOUS
  Administered 2015-10-01: 4 mg via INTRAVENOUS

## 2015-10-01 MED ORDER — MIDAZOLAM HCL 2 MG/2ML IJ SOLN
INTRAMUSCULAR | Status: AC
  Filled 2015-10-01: qty 4

## 2015-10-01 MED ORDER — MAGNESIUM SULFATE 2 GM/50ML IV SOLN
2.0000 g | Freq: Once | INTRAVENOUS | Status: AC
  Administered 2015-10-01: 2 g via INTRAVENOUS
  Filled 2015-10-01: qty 50

## 2015-10-01 MED ORDER — FENTANYL CITRATE (PF) 100 MCG/2ML IJ SOLN
INTRAMUSCULAR | Status: AC
  Administered 2015-10-01: 50 ug
  Filled 2015-10-01: qty 2

## 2015-10-01 MED ORDER — IOPAMIDOL (ISOVUE-300) INJECTION 61%
INTRAVENOUS | Status: AC
  Administered 2015-10-01: 100 mL
  Filled 2015-10-01: qty 100

## 2015-10-01 NOTE — ED Notes (Signed)
Intubated by the ed res 

## 2015-10-01 NOTE — ED Notes (Signed)
Pt has a bruise to the rt hip

## 2015-10-01 NOTE — ED Notes (Signed)
The pt returned from c-t 
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
## 2015-10-01 NOTE — ED Notes (Signed)
THE PT RECEIVED 1400 ML NSS BY EMS PRIOR TO ARRIVAL HERE
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
## 2015-10-01 NOTE — ED Notes (Signed)
Med given to c-t   Port chest and abd performed just before he left

## 2015-10-01 NOTE — ED Notes (Signed)
Good rectal tone by dr wyatt
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
## 2015-10-01 NOTE — ED Notes (Signed)
Dr wyatt placedd og  16  AY 2214

## 2015-10-01 NOTE — ED Notes (Signed)
The pt has a lower extremity rash bi-laterally
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
## 2015-10-01 NOTE — ED Notes (Signed)
The pt had foley  Inserted by dr wyatt

## 2015-10-02 ENCOUNTER — Inpatient Hospital Stay (HOSPITAL_COMMUNITY): Payer: Medicaid Other

## 2015-10-02 ENCOUNTER — Encounter (HOSPITAL_COMMUNITY): Payer: Self-pay | Admitting: *Deleted

## 2015-10-02 DIAGNOSIS — E876 Hypokalemia: Secondary | ICD-10-CM | POA: Diagnosis present

## 2015-10-02 DIAGNOSIS — J9600 Acute respiratory failure, unspecified whether with hypoxia or hypercapnia: Secondary | ICD-10-CM | POA: Diagnosis present

## 2015-10-02 DIAGNOSIS — E872 Acidosis: Secondary | ICD-10-CM | POA: Diagnosis present

## 2015-10-02 DIAGNOSIS — Z515 Encounter for palliative care: Secondary | ICD-10-CM | POA: Diagnosis not present

## 2015-10-02 DIAGNOSIS — G253 Myoclonus: Secondary | ICD-10-CM | POA: Diagnosis present

## 2015-10-02 DIAGNOSIS — J9601 Acute respiratory failure with hypoxia: Secondary | ICD-10-CM | POA: Diagnosis not present

## 2015-10-02 DIAGNOSIS — F17210 Nicotine dependence, cigarettes, uncomplicated: Secondary | ICD-10-CM | POA: Diagnosis present

## 2015-10-02 DIAGNOSIS — N390 Urinary tract infection, site not specified: Secondary | ICD-10-CM | POA: Diagnosis present

## 2015-10-02 DIAGNOSIS — E874 Mixed disorder of acid-base balance: Secondary | ICD-10-CM | POA: Diagnosis present

## 2015-10-02 DIAGNOSIS — J9602 Acute respiratory failure with hypercapnia: Secondary | ICD-10-CM | POA: Diagnosis present

## 2015-10-02 DIAGNOSIS — R739 Hyperglycemia, unspecified: Secondary | ICD-10-CM | POA: Diagnosis present

## 2015-10-02 DIAGNOSIS — I469 Cardiac arrest, cause unspecified: Secondary | ICD-10-CM | POA: Diagnosis not present

## 2015-10-02 DIAGNOSIS — D696 Thrombocytopenia, unspecified: Secondary | ICD-10-CM | POA: Diagnosis present

## 2015-10-02 DIAGNOSIS — J690 Pneumonitis due to inhalation of food and vomit: Secondary | ICD-10-CM | POA: Diagnosis present

## 2015-10-02 DIAGNOSIS — Z66 Do not resuscitate: Secondary | ICD-10-CM | POA: Diagnosis not present

## 2015-10-02 DIAGNOSIS — R4182 Altered mental status, unspecified: Secondary | ICD-10-CM | POA: Diagnosis not present

## 2015-10-02 DIAGNOSIS — Y908 Blood alcohol level of 240 mg/100 ml or more: Secondary | ICD-10-CM | POA: Diagnosis present

## 2015-10-02 DIAGNOSIS — F10229 Alcohol dependence with intoxication, unspecified: Secondary | ICD-10-CM | POA: Diagnosis present

## 2015-10-02 DIAGNOSIS — I472 Ventricular tachycardia: Secondary | ICD-10-CM | POA: Diagnosis present

## 2015-10-02 DIAGNOSIS — G9340 Encephalopathy, unspecified: Secondary | ICD-10-CM | POA: Diagnosis not present

## 2015-10-02 DIAGNOSIS — R918 Other nonspecific abnormal finding of lung field: Secondary | ICD-10-CM | POA: Diagnosis present

## 2015-10-02 DIAGNOSIS — G931 Anoxic brain damage, not elsewhere classified: Secondary | ICD-10-CM | POA: Diagnosis not present

## 2015-10-02 DIAGNOSIS — G936 Cerebral edema: Secondary | ICD-10-CM | POA: Diagnosis not present

## 2015-10-02 DIAGNOSIS — I10 Essential (primary) hypertension: Secondary | ICD-10-CM | POA: Diagnosis not present

## 2015-10-02 LAB — BLOOD GAS, ARTERIAL
ACID-BASE DEFICIT: 9.8 mmol/L — AB (ref 0.0–2.0)
Acid-base deficit: 9.8 mmol/L — ABNORMAL HIGH (ref 0.0–2.0)
BICARBONATE: 16.8 meq/L — AB (ref 20.0–24.0)
Bicarbonate: 18 mEq/L — ABNORMAL LOW (ref 20.0–24.0)
DRAWN BY: 42624
Drawn by: 405301
FIO2: 0.4
FIO2: 0.6
LHR: 30 {breaths}/min
MECHVT: 580 mL
O2 SAT: 91.1 %
O2 Saturation: 91.6 %
PEEP/CPAP: 5 cmH2O
PEEP: 5 cmH2O
PH ART: 7.122 — AB (ref 7.350–7.450)
PO2 ART: 67.8 mmHg — AB (ref 80.0–100.0)
Patient temperature: 98.6
Patient temperature: 94.5
RATE: 22 resp/min
TCO2: 19.7 mmol/L (ref 0–100)
TCO2: 18.1 mmol/L (ref 0–100)
VT: 600 mL
pCO2 arterial: 57.4 mmHg (ref 35.0–45.0)
pCO2 arterial: 39.9 mmHg (ref 35.0–45.0)
pH, Arterial: 7.231 — ABNORMAL LOW (ref 7.350–7.450)
pO2, Arterial: 83.5 mmHg (ref 80.0–100.0)

## 2015-10-02 LAB — CBC
HCT: 49.1 % (ref 39.0–52.0)
HCT: 47.5 % (ref 39.0–52.0)
HEMOGLOBIN: 16.2 g/dL (ref 13.0–17.0)
Hemoglobin: 15.5 g/dL (ref 13.0–17.0)
MCH: 34.4 pg — ABNORMAL HIGH (ref 26.0–34.0)
MCH: 34.4 pg — AB (ref 26.0–34.0)
MCHC: 33 g/dL (ref 30.0–36.0)
MCHC: 32.6 g/dL (ref 30.0–36.0)
MCV: 104.2 fL — ABNORMAL HIGH (ref 78.0–100.0)
MCV: 105.6 fL — ABNORMAL HIGH (ref 78.0–100.0)
PLATELETS: 145 10*3/uL — AB (ref 150–400)
Platelets: 146 10*3/uL — ABNORMAL LOW (ref 150–400)
RBC: 4.71 MIL/uL (ref 4.22–5.81)
RBC: 4.5 MIL/uL (ref 4.22–5.81)
RDW: 13.1 % (ref 11.5–15.5)
RDW: 13.5 % (ref 11.5–15.5)
WBC: 14.4 10*3/uL — ABNORMAL HIGH (ref 4.0–10.5)
WBC: 19.1 10*3/uL — ABNORMAL HIGH (ref 4.0–10.5)

## 2015-10-02 LAB — BASIC METABOLIC PANEL
Anion gap: 10 (ref 5–15)
Anion gap: 10 (ref 5–15)
BUN: 6 mg/dL (ref 6–20)
BUN: 8 mg/dL (ref 6–20)
CHLORIDE: 108 mmol/L (ref 101–111)
CO2: 16 mmol/L — ABNORMAL LOW (ref 22–32)
CO2: 17 mmol/L — ABNORMAL LOW (ref 22–32)
CREATININE: 1.15 mg/dL (ref 0.61–1.24)
CREATININE: 1.1 mg/dL (ref 0.61–1.24)
Calcium: 7.6 mg/dL — ABNORMAL LOW (ref 8.9–10.3)
Calcium: 7.7 mg/dL — ABNORMAL LOW (ref 8.9–10.3)
Chloride: 107 mmol/L (ref 101–111)
GFR calc Af Amer: >60 mL/min (ref >60)
Glucose, Bld: 106 mg/dL — ABNORMAL HIGH (ref 65–99)
Glucose, Bld: 89 mg/dL (ref 65–99)
Potassium: 4.2 mmol/L (ref 3.5–5.1)
Potassium: 4 mmol/L (ref 3.5–5.1)
SODIUM: 133 mmol/L — AB (ref 135–145)
SODIUM: 135 mmol/L (ref 135–145)

## 2015-10-02 LAB — MRSA PCR SCREENING: MRSA by PCR: NEGATIVE

## 2015-10-02 LAB — POCT I-STAT 3, ART BLOOD GAS (G3+)
ACID-BASE DEFICIT: 10 mmol/L — AB (ref 0.0–2.0)
BICARBONATE: 16.7 meq/L — AB (ref 20.0–24.0)
O2 SAT: 95 %
PCO2 ART: 37.1 mmHg (ref 35.0–45.0)
PH ART: 7.262 — AB (ref 7.350–7.450)
PO2 ART: 84 mmHg (ref 80.0–100.0)
Patient temperature: 37
TCO2: 18 mmol/L (ref 0–100)

## 2015-10-02 LAB — LACTIC ACID, PLASMA
Lactic Acid, Venous: 4.4 mmol/L (ref 0.5–1.9)
Lactic Acid, Venous: 3.8 mmol/L (ref 0.5–1.9)
Lactic Acid, Venous: 2.3 mmol/L (ref 0.5–1.9)
Lactic Acid, Venous: 2.9 mmol/L (ref 0.5–1.9)

## 2015-10-02 LAB — TROPONIN I
TROPONIN I: 0.16 ng/mL — AB (ref <0.03)
Troponin I: 0.16 ng/mL (ref <0.03)

## 2015-10-02 LAB — GLUCOSE, CAPILLARY
GLUCOSE-CAPILLARY: 109 mg/dL — AB (ref 65–99)
GLUCOSE-CAPILLARY: 143 mg/dL — AB (ref 65–99)
GLUCOSE-CAPILLARY: 198 mg/dL — AB (ref 65–99)
GLUCOSE-CAPILLARY: 218 mg/dL — AB (ref 65–99)
GLUCOSE-CAPILLARY: 86 mg/dL (ref 65–99)
Glucose-Capillary: 199 mg/dL — ABNORMAL HIGH (ref 65–99)

## 2015-10-02 LAB — MAGNESIUM
MAGNESIUM: 2.1 mg/dL (ref 1.7–2.4)
MAGNESIUM: 1.9 mg/dL (ref 1.7–2.4)
MAGNESIUM: 2 mg/dL (ref 1.7–2.4)

## 2015-10-02 LAB — PHOSPHORUS
Phosphorus: 5.9 mg/dL — ABNORMAL HIGH (ref 2.5–4.6)
Phosphorus: 4.7 mg/dL — ABNORMAL HIGH (ref 2.5–4.6)
Phosphorus: 2.4 mg/dL — ABNORMAL LOW (ref 2.5–4.6)

## 2015-10-02 LAB — ABO/RH: ABO/RH(D): O POS

## 2015-10-02 LAB — CREATININE, SERUM
CREATININE: 1.14 mg/dL (ref 0.61–1.24)
GFR calc Af Amer: >60 mL/min (ref >60)

## 2015-10-02 MED ORDER — PRO-STAT SUGAR FREE PO LIQD
30.0000 mL | Freq: Two times a day (BID) | ORAL | Status: DC
  Administered 2015-10-02 – 2015-10-03 (×3): 30 mL
  Filled 2015-10-02 (×3): qty 30

## 2015-10-02 MED ORDER — ENOXAPARIN SODIUM 40 MG/0.4ML ~~LOC~~ SOLN
40.0000 mg | SUBCUTANEOUS | Status: DC
  Administered 2015-10-02 – 2015-10-05 (×4): 40 mg via SUBCUTANEOUS
  Filled 2015-10-02 (×4): qty 0.4

## 2015-10-02 MED ORDER — FENTANYL CITRATE (PF) 100 MCG/2ML IJ SOLN
25.0000 ug | INTRAMUSCULAR | Status: DC | PRN
  Administered 2015-10-02 – 2015-10-05 (×14): 100 ug via INTRAVENOUS
  Filled 2015-10-02 (×14): qty 2

## 2015-10-02 MED ORDER — IPRATROPIUM-ALBUTEROL 0.5-2.5 (3) MG/3ML IN SOLN
3.0000 mL | Freq: Four times a day (QID) | RESPIRATORY_TRACT | Status: DC
  Administered 2015-10-02 – 2015-10-06 (×17): 3 mL via RESPIRATORY_TRACT
  Filled 2015-10-02 (×17): qty 3

## 2015-10-02 MED ORDER — INSULIN ASPART 100 UNIT/ML ~~LOC~~ SOLN
2.0000 [IU] | SUBCUTANEOUS | Status: DC
  Administered 2015-10-02: 4 [IU] via SUBCUTANEOUS
  Administered 2015-10-02: 2 [IU] via SUBCUTANEOUS
  Administered 2015-10-02: 4 [IU] via SUBCUTANEOUS
  Administered 2015-10-02: 6 [IU] via SUBCUTANEOUS
  Administered 2015-10-03: 2 [IU] via SUBCUTANEOUS
  Administered 2015-10-03 (×2): 4 [IU] via SUBCUTANEOUS

## 2015-10-02 MED ORDER — METHYLPREDNISOLONE SODIUM SUCC 125 MG IJ SOLR
60.0000 mg | Freq: Three times a day (TID) | INTRAMUSCULAR | Status: DC
  Administered 2015-10-02 – 2015-10-06 (×11): 60 mg via INTRAVENOUS
  Filled 2015-10-02 (×11): qty 2

## 2015-10-02 MED ORDER — LACTATED RINGERS IV BOLUS (SEPSIS)
1000.0000 mL | Freq: Once | INTRAVENOUS | Status: AC
  Administered 2015-10-02: 1000 mL via INTRAVENOUS

## 2015-10-02 MED ORDER — FUROSEMIDE 10 MG/ML IJ SOLN
40.0000 mg | Freq: Once | INTRAMUSCULAR | Status: AC
  Administered 2015-10-02: 40 mg via INTRAVENOUS
  Filled 2015-10-02: qty 4

## 2015-10-02 MED ORDER — VITAL HIGH PROTEIN PO LIQD
1000.0000 mL | ORAL | Status: DC
  Administered 2015-10-02 – 2015-10-03 (×2): 1000 mL
  Administered 2015-10-03: 11:00:00

## 2015-10-02 MED ORDER — AMANTADINE HCL 50 MG/5ML PO SYRP
100.0000 mg | ORAL_SOLUTION | Freq: Two times a day (BID) | ORAL | Status: DC
  Administered 2015-10-02 – 2015-10-05 (×8): 100 mg
  Filled 2015-10-02 (×9): qty 10

## 2015-10-02 MED ORDER — SODIUM CHLORIDE 0.9 % IV SOLN
250.0000 mL | INTRAVENOUS | Status: DC | PRN
  Administered 2015-10-02: 250 mL via INTRAVENOUS
  Administered 2015-10-02 – 2015-10-03 (×2): 1000 mL via INTRAVENOUS

## 2015-10-02 MED ORDER — ASPIRIN 300 MG RE SUPP: 300.0000 mg | RECTAL | Status: AC

## 2015-10-02 MED ORDER — FAMOTIDINE 40 MG/5ML PO SUSR
20.0000 mg | Freq: Two times a day (BID) | ORAL | Status: DC
  Administered 2015-10-02 – 2015-10-05 (×8): 20 mg
  Filled 2015-10-02 (×8): qty 2.5

## 2015-10-02 MED ORDER — CHLORHEXIDINE GLUCONATE 0.12% ORAL RINSE (MEDLINE KIT)
15.0000 mL | Freq: Two times a day (BID) | OROMUCOSAL | Status: DC
  Administered 2015-10-02 – 2015-10-06 (×9): 15 mL via OROMUCOSAL

## 2015-10-02 MED ORDER — ANTISEPTIC ORAL RINSE SOLUTION (CORINZ)
7.0000 mL | Freq: Four times a day (QID) | OROMUCOSAL | Status: DC
  Administered 2015-10-02 – 2015-10-06 (×17): 7 mL via OROMUCOSAL

## 2015-10-02 MED ORDER — SODIUM CHLORIDE 0.9 % IV SOLN: 250.0000 mL | INTRAVENOUS | Status: DC | PRN

## 2015-10-02 MED ORDER — ASPIRIN 81 MG PO CHEW: 324.0000 mg | CHEWABLE_TABLET | ORAL | Status: AC

## 2015-10-02 NOTE — Progress Notes (Signed)
Patient ID: Zachary Schmidt, male   DOB: 1965-03-20, 50 y.o.   MRN: [id number]   LOS: 0 days   Subjective: Sedated, on vent.   Objective: Vital signs in last 24 hours: Temp:  [93.6 F (34.2 C)-98.8 F (37.1 C)] 98.8 F (37.1 C) (07/16 0730) Pulse Rate:  [81-117] 107 (07/16 0748) Resp:  [15-30] 30 (07/16 0748) BP: (85-199)/(67-111) 107/70 mmHg (07/16 0748) SpO2:  [87 %-100 %] 98 % (07/16 0748) FiO2 (%):  [40 %-100 %] 60 % (07/16 0748) Weight:  [66.7 kg (147 lb 0.8 oz)-79.379 kg (175 lb)] 66.7 kg (147 lb 0.8 oz) (07/16 0140)    VENT: PRVC/60%/5PEEP/RR30/Vt65300ml   UOP: 6145ml/h   Laboratory CBC  Recent Labs  10/02/15 0124 10/02/15 0411  WBC 14.4* 19.1*  HGB 16.2 15.5  HCT 49.1 47.5  PLT 146* 145*   BMET  Recent Labs  10-24-2015 2135 10-24-2015 2207 10/02/15 0124 10/02/15 0411  NA 137 137  --  133*  K 2.5* 2.5*  --  4.2  CL 102 99*  --  107  CO2 16*  --   --  16*  GLUCOSE 203* 190*  --  106*  BUN 6 6  --  6  CREATININE 1.16 1.30* 1.14 1.15  CALCIUM 7.8*  --   --  7.6*   CBG (last 3)   Recent Labs  10/02/15 0338  GLUCAP 109*    Physical Exam General appearance: no distress Resp: clear to auscultation bilaterally Cardio: regular rate and rhythm GI: Soft, diminished BS Neuro: E4V1tM1=6t, PERRL   Assessment/Plan: [facility] ABI -- Prognosis guarded, start amantadine ARF -- per CCM EtOH abuse FEN -- On TF VTE -- SCD's, Lovenox Dispo -- Trauma will sign off, please call with questions    Freeman CaldronMichael J. Saket Hellstrom, PA-C Pager: [phone] General Trauma PA Pager: [phone]  10/02/2015
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
## 2015-10-02 NOTE — ED Notes (Signed)
Critical care at the bedside. 

## 2015-10-02 NOTE — ED Notes (Signed)
No family here  At present

## 2015-10-02 NOTE — Procedures (Signed)
Arterial Catheter Insertion Procedure Note Zachary Schmidt [id number] 06-13-1965  Procedure: Insertion of Arterial Catheter  Indications: Blood pressure monitoring  Procedure Details Consent: Unable to obtain consent because of emergent medical necessity. Time Out: Verified patient identification, verified procedure, site/side was marked, verified correct patient position, special equipment/implants available, medications/allergies/relevent history reviewed, required imaging and test results available.  Performed  Maximum sterile technique was used including antiseptics. Skin prep: Chlorhexidine; local anesthetic administered 20 gauge catheter was inserted into left radial artery using the Seldinger technique.  Evaluation Blood flow good; BP tracing good. Complications: No apparent complications    Aline placed per MD order.  RT will continue to monitor.   Zachary Schmidt 10/02/2015
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
## 2015-10-02 NOTE — ED Notes (Signed)
Report called to rn on 3100  [id number]

## 2015-10-03 ENCOUNTER — Inpatient Hospital Stay (HOSPITAL_COMMUNITY): Payer: Medicaid Other

## 2015-10-03 DIAGNOSIS — I469 Cardiac arrest, cause unspecified: Secondary | ICD-10-CM

## 2015-10-03 DIAGNOSIS — R4182 Altered mental status, unspecified: Secondary | ICD-10-CM

## 2015-10-03 DIAGNOSIS — J9601 Acute respiratory failure with hypoxia: Principal | ICD-10-CM

## 2015-10-03 DIAGNOSIS — G9340 Encephalopathy, unspecified: Secondary | ICD-10-CM

## 2015-10-03 LAB — GLUCOSE, CAPILLARY
GLUCOSE-CAPILLARY: 141 mg/dL — AB (ref 65–99)
GLUCOSE-CAPILLARY: 171 mg/dL — AB (ref 65–99)
Glucose-Capillary: 199 mg/dL — ABNORMAL HIGH (ref 65–99)
Glucose-Capillary: 170 mg/dL — ABNORMAL HIGH (ref 65–99)
Glucose-Capillary: 130 mg/dL — ABNORMAL HIGH (ref 65–99)

## 2015-10-03 LAB — TYPE AND SCREEN
ABO/RH(D): O POS
ANTIBODY SCREEN: NEGATIVE
UNIT DIVISION: 0
Unit division: 0

## 2015-10-03 LAB — PHOSPHORUS
Phosphorus: 2.7 mg/dL (ref 2.5–4.6)
Phosphorus: 1.9 mg/dL — ABNORMAL LOW (ref 2.5–4.6)

## 2015-10-03 LAB — BASIC METABOLIC PANEL
Anion gap: 7 (ref 5–15)
BUN: 19 mg/dL (ref 6–20)
CALCIUM: 8 mg/dL — AB (ref 8.9–10.3)
CO2: 24 mmol/L (ref 22–32)
CREATININE: 0.83 mg/dL (ref 0.61–1.24)
Chloride: 106 mmol/L (ref 101–111)
GFR calc Af Amer: >60 mL/min (ref >60)
GLUCOSE: 174 mg/dL — AB (ref 65–99)
POTASSIUM: 2.9 mmol/L — AB (ref 3.5–5.1)
SODIUM: 137 mmol/L (ref 135–145)

## 2015-10-03 LAB — CBC
HCT: 41 % (ref 39.0–52.0)
Hemoglobin: 13.6 g/dL (ref 13.0–17.0)
MCH: 33.8 pg (ref 26.0–34.0)
MCHC: 33.2 g/dL (ref 30.0–36.0)
MCV: 102 fL — AB (ref 78.0–100.0)
PLATELETS: 126 10*3/uL — AB (ref 150–400)
RBC: 4.02 MIL/uL — AB (ref 4.22–5.81)
RDW: 13.2 % (ref 11.5–15.5)
WBC: 26 10*3/uL — ABNORMAL HIGH (ref 4.0–10.5)

## 2015-10-03 LAB — ECHOCARDIOGRAM COMPLETE
HEIGHTINCHES: 71 in
Weight: 2352.75 oz

## 2015-10-03 LAB — MAGNESIUM
MAGNESIUM: 2 mg/dL (ref 1.7–2.4)
MAGNESIUM: 2.2 mg/dL (ref 1.7–2.4)

## 2015-10-03 LAB — BLOOD PRODUCT ORDER (VERBAL) VERIFICATION

## 2015-10-03 LAB — HEMOGLOBIN A1C
HEMOGLOBIN A1C: 4.9 % (ref 4.8–5.6)
Mean Plasma Glucose: 94 mg/dL

## 2015-10-03 LAB — PROCALCITONIN: Procalcitonin: 8.73 ng/mL

## 2015-10-03 MED ORDER — DEXTROSE 5 % IV SOLN
30.0000 mmol | Freq: Once | INTRAVENOUS | Status: AC
  Administered 2015-10-03: 30 mmol via INTRAVENOUS
  Filled 2015-10-03: qty 10

## 2015-10-03 MED ORDER — SODIUM CHLORIDE 0.9 % IV SOLN
1.0000 mg | Freq: Every day | INTRAVENOUS | Status: DC
  Administered 2015-10-03 – 2015-10-05 (×3): 1 mg via INTRAVENOUS
  Filled 2015-10-03 (×6): qty 0.2

## 2015-10-03 MED ORDER — SODIUM CHLORIDE 0.9 % IV SOLN
3.0000 g | Freq: Three times a day (TID) | INTRAVENOUS | Status: DC
  Administered 2015-10-03 – 2015-10-06 (×9): 3 g via INTRAVENOUS
  Filled 2015-10-03 (×10): qty 3

## 2015-10-03 MED ORDER — VITAL HIGH PROTEIN PO LIQD
1000.0000 mL | ORAL | Status: DC
  Administered 2015-10-03 – 2015-10-04 (×3): 1000 mL

## 2015-10-03 MED ORDER — POTASSIUM CHLORIDE 10 MEQ/100ML IV SOLN
10.0000 meq | INTRAVENOUS | Status: AC
  Administered 2015-10-03 (×4): 10 meq via INTRAVENOUS
  Filled 2015-10-03 (×2): qty 100

## 2015-10-03 MED ORDER — POTASSIUM CHLORIDE CRYS ER 10 MEQ PO TBCR
EXTENDED_RELEASE_TABLET | ORAL | Status: AC
  Filled 2015-10-03: qty 4

## 2015-10-03 MED ORDER — ASPIRIN 300 MG RE SUPP: 300.0000 mg | RECTAL | Status: DC

## 2015-10-03 MED ORDER — THIAMINE HCL 100 MG/ML IJ SOLN
100.0000 mg | Freq: Every day | INTRAMUSCULAR | Status: DC
  Administered 2015-10-03 – 2015-10-05 (×3): 100 mg via INTRAVENOUS
  Filled 2015-10-03 (×3): qty 2

## 2015-10-03 MED ORDER — ASPIRIN 81 MG PO CHEW: 324.0000 mg | CHEWABLE_TABLET | ORAL | Status: DC

## 2015-10-03 MED ORDER — POTASSIUM CHLORIDE CRYS ER 20 MEQ PO TBCR
40.0000 meq | EXTENDED_RELEASE_TABLET | Freq: Two times a day (BID) | ORAL | Status: DC
  Administered 2015-10-03: 40 meq via ORAL

## 2015-10-03 MED ORDER — ACETAMINOPHEN 160 MG/5ML PO SOLN
650.0000 mg | Freq: Four times a day (QID) | ORAL | Status: DC | PRN
  Administered 2015-10-03 – 2015-10-04 (×2): 650 mg
  Filled 2015-10-03 (×2): qty 20.3

## 2015-10-03 MED ORDER — AMLODIPINE 1 MG/ML ORAL SUSPENSION
5.0000 mg | Freq: Every day | ORAL | Status: DC
  Administered 2015-10-03 – 2015-10-04 (×2): 5 mg
  Filled 2015-10-03 (×3): qty 5

## 2015-10-03 MED ORDER — POTASSIUM CHLORIDE 10 MEQ/100ML IV SOLN
INTRAVENOUS | Status: AC
  Filled 2015-10-03: qty 100

## 2015-10-03 MED ORDER — INSULIN ASPART 100 UNIT/ML ~~LOC~~ SOLN
0.0000 [IU] | SUBCUTANEOUS | Status: DC
  Administered 2015-10-03: 1 [IU] via SUBCUTANEOUS
  Administered 2015-10-03 – 2015-10-04 (×5): 2 [IU] via SUBCUTANEOUS
  Administered 2015-10-04: 1 [IU] via SUBCUTANEOUS
  Administered 2015-10-04 – 2015-10-05 (×5): 2 [IU] via SUBCUTANEOUS
  Administered 2015-10-05: 1 [IU] via SUBCUTANEOUS
  Administered 2015-10-05: 2 [IU] via SUBCUTANEOUS
  Administered 2015-10-06 (×2): 1 [IU] via SUBCUTANEOUS

## 2015-10-03 MED ORDER — LABETALOL HCL 5 MG/ML IV SOLN
20.0000 mg | INTRAVENOUS | Status: DC | PRN
Start: 2015-10-03 — End: 2015-10-06
  Administered 2015-10-03: 20 mg via INTRAVENOUS
  Filled 2015-10-03: qty 4

## 2015-10-03 MED ORDER — SODIUM CHLORIDE 0.9 % IV SOLN
250.0000 mL | INTRAVENOUS | Status: DC | PRN
  Administered 2015-10-04: 50 mL via INTRAVENOUS
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
## 2015-10-03 NOTE — Progress Notes (Signed)
Echocardiogram 2D Echocardiogram has been performed.  Nolon RodBrown, Tony 10/03/2015, 2:58 PM

## 2015-10-03 NOTE — Progress Notes (Signed)
Initial Nutrition Assessment  INTERVENTION:   Vital High Protein @ 65 ml/hr Provides: 1560 kcal, 136 grams protein, and 1304 ml H2O.  TF regimen and propofol at current rate providing 2151 total kcal/day (99 % of kcal needs)  NUTRITION DIAGNOSIS:   Inadequate oral intake related to inability to eat as evidenced by NPO status.  GOAL:   Patient will meet greater than or equal to 90% of their needs  MONITOR:   TF tolerance, I & O's, Vent status, Labs  REASON FOR ASSESSMENT:   Consult Enteral/tube feeding initiation and management  ASSESSMENT:   Pt with hx of alcoholism presented post arrest with ETOH > 300 therefore not started on hypothermia protocol.    Patient is currently intubated on ventilator support MV: 18 L/min Temp (24hrs), Avg:98.3 F (36.8 C), Min:96.8 F (36 C), Max:99.9 F (37.7 C)  Propofol: 22.4 ml/hr = 591 kcal  Medications reviewed and include: folic acid, solumedrol, KCl, thiamine Labs reviewed: K= 2.9, PO4 and Magnesium are WNL CBG's: 141-170 Nutrition-Focused physical exam completed. Findings are no fat depletion, no muscle depletion, and no edema.  OG tube Pt discussed during ICU rounds and with RN.    Diet Order:  Diet NPO time specified  Skin:  Reviewed, no issues  Last BM:  unknown  Height:   Ht Readings from Last 1 Encounters:  10/02/15 5\' 11"  (1.803 m)    Weight:   Wt Readings from Last 1 Encounters:  10/03/15 147 lb 0.8 oz (66.7 kg)    Ideal Body Weight:  78.1 kg  BMI:  Body mass index is 20.52 kg/(m^2).  Estimated Nutritional Needs:   Kcal:  2165  Protein:  100-130 grams  Fluid:  >2.1 L/day  EDUCATION NEEDS:   No education needs identified at this time  Kendell BaneHeather Nychelle Cassata RD, LDN, CNSC [phone] Pager [phone] After Hours Pager

## 2015-10-03 NOTE — Procedures (Signed)
ELECTROENCEPHALOGRAM REPORT  Date of Study: 10/03/2015  Patient's Name: Zachary Schmidt MRN: [id number] Date of Birth: 1965-10-18  Referring Provider: Dr. Danford BadKathryn Whiteheart  Clinical History: This is a 50 year old man found unresponsive in asystole s/p CPR with ROSC after approximately 586m. He has intermittent myocolonus.  Medications: Propofol infusion acetaminophen (TYLENOL) solution 650 mg  amantadine (SYMMETREL) solution 100 mg  amLODipine (NORVASC) 1 mg/mL oral suspension 5 mg  famotidine (PEPCID) 40 MG/5ML suspension 20 mg  folic acid 1 mg in sodium chloride 0.9 % 50 mL IVPB  insulin aspart (novoLOG) injection 0-9 Units  ipratropium-albuterol (DUONEB) 0.5-2.5 (3) MG/3ML nebulizer solution 3 mL  labetalol (NORMODYNE,TRANDATE) injection 20 mg  methylPREDNISolone sodium succinate (SOLU-MEDROL) 125 mg/2 mL injection 60 mg  potassium chloride 10 mEq in 100 mL IVPB  thiamine (B-1) injection 100 mg   Technical Summary: A multichannel digital EEG recording measured by the international 10-20 system with electrodes applied with paste and impedances below 5000 ohms performed in our laboratory with EKG monitoring in an intubated and sedated patient.  Hyperventilation and photic stimulation were not performed.  The digital EEG was referentially recorded, reformatted, and digitally filtered in a variety of bipolar and referential montages for optimal display.    Description: The patient is intubated and sedated on Propofol during the recording. There is loss of normal background activity. The record read at a sensitivity of 3 uV/mm shows diffuse suppression of background activity with muscle and eye blink artifact over the frontal leads. There is no spontaneous reactivity or reactivity noted with noxious stimulation. Hyperventilation and photic stimulation were not performed. There were no epileptiform discharges or electrographic seizures seen.   EKG lead was  unremarkable.  Impression: This EEG is markedly abnormal due to diffuse background suppression and lack of EEG reactivity with noxious stimulation.  Clinical Correlation of the above findings indicates severe diffuse cerebral dysfunction that is non-specific in etiology and can be seen in the setting of anoxic/ischemic injury, toxic/metabolic encephalopathies, or medication effect. In the absence of CNS active, sedating, or anesthetic medications, this suggests a poor prognosis. If further clinical questions remain, repeat EEG off sedation may be helpful. Clinical correlation is advised.  Patrcia DollyKaren Chrystie Hagwood, M.D.

## 2015-10-03 NOTE — Progress Notes (Signed)
Bedside EEG completed, results pending. 

## 2015-10-03 NOTE — Progress Notes (Signed)
eLink Physician-Brief Progress Note Patient Name: Zachary Schmidt DOB: [birth year] MRN: [id number]   Date of Service  10/03/2015  HPI/Events of Note  PO4--- = 1.9 and Creatinine = 0.83.  eICU Interventions  Will replete PO4---.     Intervention Category Major Interventions: Electrolyte abnormality - evaluation and management  Sommer,Steven Eugene 10/03/2015, 6:52 PM

## 2015-10-04 ENCOUNTER — Other Ambulatory Visit (HOSPITAL_COMMUNITY): Payer: 59

## 2015-10-04 ENCOUNTER — Inpatient Hospital Stay (HOSPITAL_COMMUNITY): Payer: Medicaid Other

## 2015-10-04 ENCOUNTER — Encounter (HOSPITAL_COMMUNITY): Payer: Self-pay | Admitting: Radiology

## 2015-10-04 LAB — TRIGLYCERIDES: TRIGLYCERIDES: 104 mg/dL (ref <150)

## 2015-10-04 LAB — COMPREHENSIVE METABOLIC PANEL
ALT: 57 U/L (ref 17–63)
ANION GAP: 8 (ref 5–15)
AST: 66 U/L — ABNORMAL HIGH (ref 15–41)
Albumin: 2.6 g/dL — ABNORMAL LOW (ref 3.5–5.0)
Alkaline Phosphatase: 26 U/L — ABNORMAL LOW (ref 38–126)
BUN: 22 mg/dL — ABNORMAL HIGH (ref 6–20)
CHLORIDE: 107 mmol/L (ref 101–111)
CO2: 24 mmol/L (ref 22–32)
Calcium: 8.5 mg/dL — ABNORMAL LOW (ref 8.9–10.3)
Creatinine, Ser: 0.7 mg/dL (ref 0.61–1.24)
GFR calc non Af Amer: >60 mL/min (ref >60)
Glucose, Bld: 139 mg/dL — ABNORMAL HIGH (ref 65–99)
POTASSIUM: 3.9 mmol/L (ref 3.5–5.1)
SODIUM: 139 mmol/L (ref 135–145)
Total Bilirubin: 0.4 mg/dL (ref 0.3–1.2)
Total Protein: 5.1 g/dL — ABNORMAL LOW (ref 6.5–8.1)

## 2015-10-04 LAB — BLOOD GAS, ARTERIAL
Acid-Base Excess: 0.6 mmol/L (ref 0.0–2.0)
Bicarbonate: 25.2 mEq/L — ABNORMAL HIGH (ref 20.0–24.0)
DRAWN BY: 12971
FIO2: 40
LHR: 30 {breaths}/min
MECHVT: 600 mL
O2 Saturation: 98.5 %
PATIENT TEMPERATURE: 98.6
PCO2 ART: 43.7 mmHg (ref 35.0–45.0)
PEEP: 5 cmH2O
PO2 ART: 123 mmHg — AB (ref 80.0–100.0)
TCO2: 26.5 mmol/L (ref 0–100)
pH, Arterial: 7.379 (ref 7.350–7.450)

## 2015-10-04 LAB — CBC
HCT: 38.2 % — ABNORMAL LOW (ref 39.0–52.0)
Hemoglobin: 12.4 g/dL — ABNORMAL LOW (ref 13.0–17.0)
MCH: 33.2 pg (ref 26.0–34.0)
MCHC: 32.5 g/dL (ref 30.0–36.0)
MCV: 102.1 fL — ABNORMAL HIGH (ref 78.0–100.0)
PLATELETS: 130 10*3/uL — AB (ref 150–400)
RBC: 3.74 MIL/uL — ABNORMAL LOW (ref 4.22–5.81)
RDW: 13.5 % (ref 11.5–15.5)
WBC: 24 10*3/uL — ABNORMAL HIGH (ref 4.0–10.5)

## 2015-10-04 LAB — GLUCOSE, CAPILLARY
GLUCOSE-CAPILLARY: 143 mg/dL — AB (ref 65–99)
GLUCOSE-CAPILLARY: 155 mg/dL — AB (ref 65–99)
GLUCOSE-CAPILLARY: 176 mg/dL — AB (ref 65–99)
GLUCOSE-CAPILLARY: 179 mg/dL — AB (ref 65–99)
GLUCOSE-CAPILLARY: 196 mg/dL — AB (ref 65–99)
Glucose-Capillary: 141 mg/dL — ABNORMAL HIGH (ref 65–99)
Glucose-Capillary: 178 mg/dL — ABNORMAL HIGH (ref 65–99)

## 2015-10-04 LAB — HEMOGLOBIN A1C
HEMOGLOBIN A1C: 5 % (ref 4.8–5.6)
Mean Plasma Glucose: 97 mg/dL

## 2015-10-04 LAB — PROCALCITONIN: PROCALCITONIN: 5.81 ng/mL
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
## 2015-10-04 NOTE — Progress Notes (Signed)
Patient transported to and from CT with no complications 

## 2015-10-04 NOTE — Code Documentation (Signed)
Interdisciplinary Goals of Care Family Meeting    Date carried out:: 10/04/2015  Location of the meeting: Bedside  Member's involved: Physician, Bedside Registered Nurse and Family Member or next of kin  Durable Power of Attorney or acting medical decision maker: Wife    Discussion: We discussed goals of care for Loews Corporationodd L Diviney  with his wife and mother in [place]. I updated them about the clinical status including the evidence of severe anoxic injury. I told them that the chance of meaningful neurologic recovery are very poor. They are still in shock and are trying to process the information. They made clear that Mr. [name] would not want to be on life support indefinitely. They wish to make him DNR.  They have been separated recently but still legally married and keep in touch. They have 2 children from 2817-51 years old. The wife needs to prepare the children and the rest of the family for the news. We will continue to reassess the situation everyday  Code status: Limited Code or DNR with short term  Disposition: Continue current acute care  Time spent for the meeting: 30 mins  Afifa Truax 10/04/2015 12:54 PM
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
## 2015-10-04 NOTE — Care Management Note (Addendum)
Case Management Note  Patient Details  Name: Zachary Schmidt MRN: [id number] Date of Birth: Dec 13, 1965  Subjective/Objective:   Pt admitted 09/26/2015 with acute respiratory failure, anoxic brain injury.  PTA, pt independent of ADLS; he is separated from wife and has teenaged children.                    Action/Plan: Pt currently remains on ventilator with poor neurologic prognosis.  He was made a DNR today.  Will continue to follow as pt progresses.    Expected Discharge Date:                  Expected Discharge Plan:     In-House Referral:  Clinical Social Work  Discharge planning Services  CM Consult  Post Acute Care Choice:    Choice offered to:     DME Arranged:    DME Agency:     HH Arranged:    HH Agency:     Status of Service:  In process, will continue to follow  If discussed at Long Length of Stay Meetings, dates discussed:    Additional Comments:  Quintella BatonJulie W. Samuella Rasool, RN, BSN  Trauma/Neuro ICU Case Manager [phone]

## 2015-10-04 NOTE — Progress Notes (Signed)
Ice packs applied

## 2015-10-04 NOTE — Progress Notes (Signed)
   10/04/15 1400  Clinical Encounter Type  Visited With Patient  Visit Type Initial  Referral From Chaplain  Spiritual Encounters  Spiritual Needs Prayer  Chaplain made a follow up visit to patient room per the request of Chaplain.  Chaplain visited patient.  No family present.  Chaplain prayed for the patient.

## 2015-10-05 ENCOUNTER — Inpatient Hospital Stay (HOSPITAL_COMMUNITY): Payer: Medicaid Other

## 2015-10-05 DIAGNOSIS — G931 Anoxic brain damage, not elsewhere classified: Secondary | ICD-10-CM

## 2015-10-05 DIAGNOSIS — G936 Cerebral edema: Secondary | ICD-10-CM

## 2015-10-05 DIAGNOSIS — I10 Essential (primary) hypertension: Secondary | ICD-10-CM

## 2015-10-05 LAB — BASIC METABOLIC PANEL
Anion gap: 9 (ref 5–15)
BUN: 25 mg/dL — ABNORMAL HIGH (ref 6–20)
CALCIUM: 8.7 mg/dL — AB (ref 8.9–10.3)
CHLORIDE: 106 mmol/L (ref 101–111)
CO2: 26 mmol/L (ref 22–32)
CREATININE: 0.7 mg/dL (ref 0.61–1.24)
GFR calc non Af Amer: >60 mL/min (ref >60)
GLUCOSE: 165 mg/dL — AB (ref 65–99)
Potassium: 4.4 mmol/L (ref 3.5–5.1)
Sodium: 141 mmol/L (ref 135–145)

## 2015-10-05 LAB — GLUCOSE, CAPILLARY
GLUCOSE-CAPILLARY: 168 mg/dL — AB (ref 65–99)
GLUCOSE-CAPILLARY: 151 mg/dL — AB (ref 65–99)
Glucose-Capillary: 142 mg/dL — ABNORMAL HIGH (ref 65–99)
Glucose-Capillary: 152 mg/dL — ABNORMAL HIGH (ref 65–99)
Glucose-Capillary: 176 mg/dL — ABNORMAL HIGH (ref 65–99)
Glucose-Capillary: 185 mg/dL — ABNORMAL HIGH (ref 65–99)

## 2015-10-05 LAB — BLOOD GAS, ARTERIAL
Acid-Base Excess: 3.1 mmol/L — ABNORMAL HIGH (ref 0.0–2.0)
BICARBONATE: 26.8 meq/L — AB (ref 20.0–24.0)
Drawn by: 39898
FIO2: 0.4
LHR: 30 {breaths}/min
O2 SAT: 98.9 %
PATIENT TEMPERATURE: 98.6
PCO2 ART: 39.3 mmHg (ref 35.0–45.0)
PEEP: 5 cmH2O
PH ART: 7.449 (ref 7.350–7.450)
PO2 ART: 131 mmHg — AB (ref 80.0–100.0)
TCO2: 28 mmol/L (ref 0–100)
VT: 600 mL

## 2015-10-05 LAB — CBC
HEMATOCRIT: 37.4 % — AB (ref 39.0–52.0)
HEMOGLOBIN: 12.1 g/dL — AB (ref 13.0–17.0)
MCH: 33.4 pg (ref 26.0–34.0)
MCHC: 32.4 g/dL (ref 30.0–36.0)
MCV: 103.3 fL — AB (ref 78.0–100.0)
Platelets: 150 10*3/uL (ref 150–400)
RBC: 3.62 MIL/uL — ABNORMAL LOW (ref 4.22–5.81)
RDW: 14.1 % (ref 11.5–15.5)
WBC: 19.7 10*3/uL — ABNORMAL HIGH (ref 4.0–10.5)

## 2015-10-05 LAB — PHOSPHORUS: Phosphorus: 3.4 mg/dL (ref 2.5–4.6)

## 2015-10-05 LAB — MAGNESIUM: Magnesium: 2 mg/dL (ref 1.7–2.4)

## 2015-10-05 LAB — PROCALCITONIN: PROCALCITONIN: 3.36 ng/mL

## 2015-10-05 LAB — TRIGLYCERIDES: Triglycerides: 92 mg/dL (ref <150)

## 2015-10-05 MED ORDER — AMLODIPINE BESYLATE 5 MG PO TABS
5.0000 mg | ORAL_TABLET | Freq: Every day | ORAL | Status: DC
  Administered 2015-10-05: 5 mg
  Filled 2015-10-05: qty 1

## 2015-10-05 MED ORDER — METOPROLOL TARTRATE 25 MG/10 ML ORAL SUSPENSION
12.5000 mg | Freq: Two times a day (BID) | ORAL | Status: DC
  Administered 2015-10-05 (×2): 12.5 mg via ORAL
  Filled 2015-10-05 (×2): qty 10
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
## 2015-10-05 NOTE — Progress Notes (Signed)
Call placed to Zachary Schmidt as per request from bedside RN Katie.  There had been a prior call to CDS about this patient.  Updated information and requested coordinator call to bedside to check into possible withdrawal of care.  Katie RN updated on this.  Will continue to follow peripherally.  CDS referral # [id number]

## 2015-10-06 LAB — GLUCOSE, CAPILLARY
GLUCOSE-CAPILLARY: 134 mg/dL — AB (ref 65–99)
GLUCOSE-CAPILLARY: 146 mg/dL — AB (ref 65–99)

## 2015-10-06 LAB — CULTURE, RESPIRATORY

## 2015-10-06 LAB — CULTURE, RESPIRATORY W GRAM STAIN

## 2015-10-06 MED ORDER — ALBUTEROL SULFATE (2.5 MG/3ML) 0.083% IN NEBU: 2.5000 mg | INHALATION_SOLUTION | RESPIRATORY_TRACT | Status: DC | PRN

## 2015-10-06 MED ORDER — MIDAZOLAM HCL 2 MG/2ML IJ SOLN: 2.0000 mg | INTRAMUSCULAR | Status: DC | PRN

## 2015-10-06 MED ORDER — SODIUM CHLORIDE 0.9 % IV SOLN
1.0000 mg/h | INTRAVENOUS | Status: DC
  Administered 2015-10-06: 1 mg/h via INTRAVENOUS
  Filled 2015-10-06: qty 10

## 2015-10-06 MED ORDER — FOLIC ACID 5 MG/ML IJ SOLN
1.0000 mg | Freq: Every day | INTRAMUSCULAR | Status: DC
  Filled 2015-10-06: qty 0.2

## 2015-10-07 ENCOUNTER — Encounter (HOSPITAL_COMMUNITY): Payer: Self-pay | Admitting: Emergency Medicine

## 2015-10-07 LAB — CULTURE, BLOOD (ROUTINE X 2)
CULTURE: NO GROWTH
Culture: NO GROWTH

## 2015-10-18 NOTE — Procedures (Signed)
Extubation Procedure Note  Patient Details:   Name: Zachary Schmidt DOB: 07/14/1965 MRN: [id number]   Pt terminally extubated per MD order with RN at bedside. No distress noted at this time, RT to monitor.    Evaluation  O2 sats: stable throughout Complications: No apparent complications Patient did tolerate procedure well. Bilateral Breath Sounds: Diminished   No  [name], [name] 09/21/2015, 11:45 AM

## 2015-10-18 NOTE — Progress Notes (Signed)
240 mg morphine IV wasted in sink- witness K. Meyran, RN
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
## 2015-10-18 NOTE — Progress Notes (Signed)
   10/05/2015 1130  Clinical Encounter Type  Visited With Patient and family together  Visit Type Patient actively dying;Death  Referral From Nurse  Spiritual Encounters  Spiritual Needs Prayer;[facility] text;Emotional;Grief support  Chaplain responded to consult for an end of life.  Patient removed from ventilator.  Chaplain read scripture and prayed with family.  Chaplain remained with family through end of life.  Chaplain prayed for deceased patient then again for the family.  Chaplain remained with family for further support.  Chaplain accompanied family to waiting area and provided addition support. Chaplain remained with family until they decided make to leave.  Chaplain made further support available if desired,

## 2015-10-18 NOTE — Progress Notes (Signed)
Nutrition Brief Note  Chart reviewed. Pt now transitioning to comfort care.  No further nutrition interventions warranted at this time.  Please re-consult as needed.   Brylon Brenning RD, LDN, CNSC [phone] Pager [phone] After Hours Pager    
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
## 2015-10-18 DEATH — deceased
